# Patient Record
Sex: Female | Born: 1945 | Race: White | Hispanic: No | Marital: Married | Smoking: Former smoker
Health system: Southern US, Community
[De-identification: ages and names within clinical notes are randomized; demographics above are authoritative.]

## PROBLEM LIST (undated history)

## (undated) DIAGNOSIS — E119 Type 2 diabetes mellitus without complications: Secondary | ICD-10-CM

## (undated) DIAGNOSIS — I2510 Atherosclerotic heart disease of native coronary artery without angina pectoris: Secondary | ICD-10-CM

## (undated) DIAGNOSIS — N814 Uterovaginal prolapse, unspecified: Secondary | ICD-10-CM

## (undated) DIAGNOSIS — E785 Hyperlipidemia, unspecified: Secondary | ICD-10-CM

## (undated) DIAGNOSIS — E039 Hypothyroidism, unspecified: Secondary | ICD-10-CM

## (undated) DIAGNOSIS — I10 Essential (primary) hypertension: Secondary | ICD-10-CM

## (undated) HISTORY — DX: Essential (primary) hypertension: I10

## (undated) HISTORY — DX: Hypothyroidism, unspecified: E03.9

## (undated) HISTORY — PX: KNEE ARTHROSCOPY: SUR90

## (undated) HISTORY — PX: PERCUTANEOUS CORONARY STENT INTERVENTION (PCI-S): SHX6016

## (undated) HISTORY — DX: Uterovaginal prolapse, unspecified: N81.4

## (undated) HISTORY — PX: UTERINE SUSPENSION: SUR1430

## (undated) HISTORY — DX: Type 2 diabetes mellitus without complications: E11.9

## (undated) HISTORY — DX: Hyperlipidemia, unspecified: E78.5

## (undated) HISTORY — PX: VAGINAL HYSTERECTOMY: SUR661

## (undated) HISTORY — DX: Atherosclerotic heart disease of native coronary artery without angina pectoris: I25.10

---

## 1998-03-08 ENCOUNTER — Other Ambulatory Visit: Admission: RE | Admit: 1998-03-08 | Discharge: 1998-03-08 | Payer: Self-pay | Admitting: Obstetrics and Gynecology

## 1999-04-11 ENCOUNTER — Encounter: Admission: RE | Admit: 1999-04-11 | Discharge: 1999-04-11 | Payer: Self-pay | Admitting: Obstetrics and Gynecology

## 1999-04-11 ENCOUNTER — Encounter: Payer: Self-pay | Admitting: Internal Medicine

## 1999-04-12 ENCOUNTER — Other Ambulatory Visit: Admission: RE | Admit: 1999-04-12 | Discharge: 1999-04-12 | Payer: Self-pay | Admitting: Obstetrics and Gynecology

## 2000-04-09 ENCOUNTER — Encounter: Payer: Self-pay | Admitting: Obstetrics and Gynecology

## 2000-04-09 ENCOUNTER — Encounter: Admission: RE | Admit: 2000-04-09 | Discharge: 2000-04-09 | Payer: Self-pay | Admitting: Obstetrics and Gynecology

## 2000-06-26 ENCOUNTER — Other Ambulatory Visit: Admission: RE | Admit: 2000-06-26 | Discharge: 2000-06-26 | Payer: Self-pay | Admitting: Obstetrics and Gynecology

## 2001-04-24 ENCOUNTER — Encounter: Admission: RE | Admit: 2001-04-24 | Discharge: 2001-04-24 | Payer: Self-pay | Admitting: Obstetrics and Gynecology

## 2001-04-24 ENCOUNTER — Encounter: Payer: Self-pay | Admitting: Obstetrics and Gynecology

## 2002-06-21 ENCOUNTER — Encounter: Admission: RE | Admit: 2002-06-21 | Discharge: 2002-06-21 | Payer: Self-pay | Admitting: Obstetrics and Gynecology

## 2002-06-21 ENCOUNTER — Encounter: Payer: Self-pay | Admitting: Obstetrics and Gynecology

## 2003-06-30 ENCOUNTER — Encounter: Admission: RE | Admit: 2003-06-30 | Discharge: 2003-06-30 | Payer: Self-pay | Admitting: Obstetrics and Gynecology

## 2003-07-22 ENCOUNTER — Encounter (INDEPENDENT_AMBULATORY_CARE_PROVIDER_SITE_OTHER): Payer: Self-pay | Admitting: Specialist

## 2003-07-22 ENCOUNTER — Inpatient Hospital Stay (HOSPITAL_COMMUNITY): Admission: RE | Admit: 2003-07-22 | Discharge: 2003-07-24 | Payer: Self-pay | Admitting: Obstetrics and Gynecology

## 2004-05-12 ENCOUNTER — Inpatient Hospital Stay (HOSPITAL_COMMUNITY): Admission: EM | Admit: 2004-05-12 | Discharge: 2004-05-15 | Payer: Self-pay | Admitting: Emergency Medicine

## 2004-05-14 ENCOUNTER — Encounter: Payer: Self-pay | Admitting: Internal Medicine

## 2004-05-14 ENCOUNTER — Ambulatory Visit: Payer: Self-pay | Admitting: Cardiology

## 2004-05-31 ENCOUNTER — Ambulatory Visit: Payer: Self-pay | Admitting: Internal Medicine

## 2004-06-11 ENCOUNTER — Encounter (HOSPITAL_COMMUNITY): Admission: RE | Admit: 2004-06-11 | Discharge: 2004-08-28 | Payer: Self-pay | Admitting: Cardiology

## 2004-06-26 ENCOUNTER — Ambulatory Visit: Payer: Self-pay | Admitting: Cardiology

## 2004-07-20 ENCOUNTER — Ambulatory Visit: Payer: Self-pay | Admitting: Cardiology

## 2004-10-15 ENCOUNTER — Ambulatory Visit: Payer: Self-pay | Admitting: Cardiology

## 2005-01-18 ENCOUNTER — Encounter: Admission: RE | Admit: 2005-01-18 | Discharge: 2005-01-18 | Payer: Self-pay | Admitting: Internal Medicine

## 2005-03-06 ENCOUNTER — Encounter: Admission: RE | Admit: 2005-03-06 | Discharge: 2005-03-06 | Payer: Self-pay | Admitting: Internal Medicine

## 2005-04-03 ENCOUNTER — Ambulatory Visit: Payer: Self-pay | Admitting: Cardiology

## 2005-05-01 ENCOUNTER — Ambulatory Visit: Payer: Self-pay | Admitting: Cardiology

## 2005-05-24 ENCOUNTER — Encounter: Admission: RE | Admit: 2005-05-24 | Discharge: 2005-05-24 | Payer: Self-pay | Admitting: Internal Medicine

## 2005-07-30 ENCOUNTER — Ambulatory Visit: Payer: Self-pay | Admitting: Cardiology

## 2006-02-04 ENCOUNTER — Encounter: Admission: RE | Admit: 2006-02-04 | Discharge: 2006-02-04 | Payer: Self-pay | Admitting: Obstetrics and Gynecology

## 2006-02-20 ENCOUNTER — Ambulatory Visit: Payer: Self-pay | Admitting: Cardiology

## 2006-02-25 ENCOUNTER — Encounter: Payer: Self-pay | Admitting: Cardiology

## 2006-02-25 ENCOUNTER — Ambulatory Visit: Payer: Self-pay

## 2006-05-02 ENCOUNTER — Encounter: Admission: RE | Admit: 2006-05-02 | Discharge: 2006-05-02 | Payer: Self-pay | Admitting: Internal Medicine

## 2006-08-20 ENCOUNTER — Ambulatory Visit: Payer: Self-pay | Admitting: Cardiology

## 2007-02-16 ENCOUNTER — Encounter: Admission: RE | Admit: 2007-02-16 | Discharge: 2007-02-16 | Payer: Self-pay | Admitting: Obstetrics and Gynecology

## 2007-06-10 ENCOUNTER — Ambulatory Visit: Payer: Self-pay | Admitting: Cardiology

## 2008-03-18 ENCOUNTER — Encounter: Admission: RE | Admit: 2008-03-18 | Discharge: 2008-03-18 | Payer: Self-pay | Admitting: Obstetrics and Gynecology

## 2008-06-08 ENCOUNTER — Telehealth: Payer: Self-pay | Admitting: Cardiology

## 2008-07-07 DIAGNOSIS — E039 Hypothyroidism, unspecified: Secondary | ICD-10-CM

## 2008-07-07 DIAGNOSIS — I10 Essential (primary) hypertension: Secondary | ICD-10-CM

## 2008-07-07 DIAGNOSIS — I2510 Atherosclerotic heart disease of native coronary artery without angina pectoris: Secondary | ICD-10-CM | POA: Insufficient documentation

## 2008-07-14 ENCOUNTER — Ambulatory Visit: Payer: Self-pay | Admitting: Cardiology

## 2008-08-18 ENCOUNTER — Telehealth: Payer: Self-pay | Admitting: Cardiology

## 2008-09-15 ENCOUNTER — Encounter: Payer: Self-pay | Admitting: Cardiology

## 2009-04-18 ENCOUNTER — Encounter: Admission: RE | Admit: 2009-04-18 | Discharge: 2009-04-18 | Payer: Self-pay | Admitting: Internal Medicine

## 2009-09-21 ENCOUNTER — Ambulatory Visit: Payer: Self-pay | Admitting: Cardiology

## 2010-02-27 NOTE — Assessment & Plan Note (Signed)
Summary: f1y   Visit Type:  1 year follow up  CC:  No cardiac complains.  History of Present Illness: Overall doing well.  Denies any chest pain.  Using Voltaren gel on ankle and knee.  Otherwise doing well.  Denies trouble.  Current Medications (verified): 1)  Nitrostat 0.4 Mg Subl (Nitroglycerin) .Marland Kitchen.. 1 Tablet Under Tongue At Onset of Chest Pain; You May Repeat Every 5 Minutes For Up To 3 Doses. 2)  Plavix 75 Mg Tabs (Clopidogrel Bisulfate) .... Take One Tablet By Mouth Daily 3)  Simvastatin 40 Mg Tabs (Simvastatin) .... Take One Tablet By Mouth Daily At Bedtime 4)  Toprol Xl 100 Mg Xr24h-Tab (Metoprolol Succinate) .... Take 1 Tablet By Mouth Once A Day 5)  Protonix 40 Mg Tbec (Pantoprazole Sodium) .... Take 1 Tablet By Mouth Once A Day 6)  Aspirin 81 Mg Tbec (Aspirin) .... Take One Tablet By Mouth Daily 7)  Diovan 160 Mg Tabs (Valsartan) .... Take 1 Tablet By Mouth Once A Day 8)  Synthroid 137 Mcg Tabs (Levothyroxine Sodium) .... Take 1 Tablet By Mouth Once A Day 9)  Multivitamins  Tabs (Multiple Vitamin) .... Take 1 Tablet By Mouth Once A Day 10)  Fish Oil 1000 Mg Caps (Omega-3 Fatty Acids) .... Take 1 Capsule By Mouth Once A Day 11)  Vitamin D 2000 Unit Tabs (Cholecalciferol) .... Take 1 Tablet By Mouth Once A Day  Allergies: 1)  Codeine Phosphate (Codeine Phosphate)  Vital Signs:  Patient profile:   65 year old female Height:      63 inches Weight:      199.50 pounds BMI:     35.47 Pulse rate:   88 / minute Pulse rhythm:   regular Resp:     18 per minute BP sitting:   150 / 80  (left arm) Cuff size:   large  Vitals Entered By: Vikki Ports (September 21, 2009 12:20 PM)  Physical Exam  General:  Well developed, well nourished, in no acute distress. Head:  normocephalic and atraumatic Eyes:  PERRLA/EOM intact; conjunctiva and lids normal. Lungs:  Clear bilaterally to auscultation and percussion. Heart:  PMI non displaced.  Normal S1 and S2.  No murmur.  Pulses:   pulses normal in all 4 extremities Extremities:  No clubbing or cyanosis. Neurologic:  Alert and oriented x 3.   Cardiac Cath  Procedure date:  05/12/2004  Findings:       ANGIOGRAPHIC DATA:  1.  The left main coronary artery was free of critical disease.  2.  The LAD has about a 40% area of narrowing at the takeoff of the      diagonal.  Distal to this there is a segmental area of disease measuring      about 50-60% luminal reduction.  The distal vessel wraps the apex.  The      circumflex provides three marginal branches and has mild luminal      irregularity but no critical stenoses.  3.  The right coronary is totally occluded in its mid portion.  Following      stenting, this vessel was reduced to 0% residual luminal narrowing and      is a large caliber vessel with about 3.2 luminal diameter.  There was no      evidence of edge tear. There was TIMI III flow in the distal vessel      after reperfusion.  There is mild luminal irregularity of the distal      vessel  but no high-grade areas of stenosis.  4.  The ventriculogram demonstrates inferobasal hypokinesis with ejection      fraction of 45%.  5.  On the initial left injections, there was evidence of distal      collateralization of the right coronary artery.   CONCLUSION:  1.  Acute inferior wall infarction, treated with primary percutaneous      coronary intervention.  2.  Hypertension.  3.  Hypothyroidism.   EKG  Procedure date:  09/21/2009  Findings:      NSR.  Inferior MI, old.    Impression & Recommendations:  Problem # 1:  CAD (ICD-414.00)  Completely stable. Discussed DAPT.  Continue to treat.  She is agreeable to current regimen.  We also went into great detail regarding interactions of platelets, and NSAIDS.  She clearly seems to need, but is also fully informed regarding potential risks. Her updated medication list for this problem includes:    Nitrostat 0.4 Mg Subl (Nitroglycerin) .Marland Kitchen... 1 tablet under  tongue at onset of chest pain; you may repeat every 5 minutes for up to 3 doses.    Plavix 75 Mg Tabs (Clopidogrel bisulfate) .Marland Kitchen... Take one tablet by mouth daily    Toprol Xl 100 Mg Xr24h-tab (Metoprolol succinate) .Marland Kitchen... Take 1 tablet by mouth once a day    Aspirin 81 Mg Tbec (Aspirin) .Marland Kitchen... Take one tablet by mouth daily  Her updated medication list for this problem includes:    Nitrostat 0.4 Mg Subl (Nitroglycerin) .Marland Kitchen... 1 tablet under tongue at onset of chest pain; you may repeat every 5 minutes for up to 3 doses.    Plavix 75 Mg Tabs (Clopidogrel bisulfate) .Marland Kitchen... Take one tablet by mouth daily    Toprol Xl 100 Mg Xr24h-tab (Metoprolol succinate) .Marland Kitchen... Take 1 tablet by mouth once a day    Aspirin 81 Mg Tbec (Aspirin) .Marland Kitchen... Take one tablet by mouth daily  Orders: EKG w/ Interpretation (93000)  Problem # 2:  HYPERLIPIDEMIA (ICD-272.4)  followed by Dr. Kevan Ny. Her updated medication list for this problem includes:    Simvastatin 40 Mg Tabs (Simvastatin) .Marland Kitchen... Take one tablet by mouth daily at bedtime  Her updated medication list for this problem includes:    Simvastatin 40 Mg Tabs (Simvastatin) .Marland Kitchen... Take one tablet by mouth daily at bedtime  Problem # 3:  HYPERTENSION (ICD-401.9) Systolic BP higher.   Her updated medication list for this problem includes:    Toprol Xl 100 Mg Xr24h-tab (Metoprolol succinate) .Marland Kitchen... Take 1 tablet by mouth once a day    Aspirin 81 Mg Tbec (Aspirin) .Marland Kitchen... Take one tablet by mouth daily    Diovan 160 Mg Tabs (Valsartan) .Marland Kitchen... Take 1 tablet by mouth once a day  Her updated medication list for this problem includes:    Toprol Xl 100 Mg Xr24h-tab (Metoprolol succinate) .Marland Kitchen... Take 1 tablet by mouth once a day    Aspirin 81 Mg Tbec (Aspirin) .Marland Kitchen... Take one tablet by mouth daily    Diovan 160 Mg Tabs (Valsartan) .Marland Kitchen... Take 1 tablet by mouth once a day  Problem # 4:  HYPOTHYROIDISM (ICD-244.9)  Dr.Gates checks regularly.  Her updated medication list for  this problem includes:    Synthroid 137 Mcg Tabs (Levothyroxine sodium) .Marland Kitchen... Take 1 tablet by mouth once a day  Her updated medication list for this problem includes:    Synthroid 137 Mcg Tabs (Levothyroxine sodium) .Marland Kitchen... Take 1 tablet by mouth once a day  Patient Instructions: 1)  Your physician recommends that you continue on your current medications as directed. Please refer to the Current Medication list given to you today. 2)  Your physician wants you to follow-up in: 1 year  You will receive a reminder letter in the mail two months in advance. If you don't receive a letter, please call our office to schedule the follow-up appointment.

## 2010-03-26 ENCOUNTER — Other Ambulatory Visit: Payer: Self-pay | Admitting: Obstetrics and Gynecology

## 2010-03-26 DIAGNOSIS — Z1231 Encounter for screening mammogram for malignant neoplasm of breast: Secondary | ICD-10-CM

## 2010-04-20 ENCOUNTER — Ambulatory Visit
Admission: RE | Admit: 2010-04-20 | Discharge: 2010-04-20 | Disposition: A | Discharge status: Home / Self Care | Payer: BC Managed Care – PPO | Source: Ambulatory Visit | Attending: Obstetrics and Gynecology | Admitting: Obstetrics and Gynecology

## 2010-04-20 DIAGNOSIS — Z1231 Encounter for screening mammogram for malignant neoplasm of breast: Secondary | ICD-10-CM

## 2010-06-12 NOTE — Assessment & Plan Note (Signed)
[facility]                                 ON-CALL NOTE   NAME:LANGLEYLinden, Ebony [name]                         MRN:          [id number]  DATE:03/13/2008                            DOB:          [birth year]    PRIMARY CARDIOLOGIST:  Arturo Morton. Riley Kill, MD, [facility]   PROBLEM:  I was contacted by Ms. Mcarthy's daughter, concerned that her  mother has been experiencing some dizziness from this morning.  She  denies any chest pain, shortness of breath, or tachy palpitations.  This  appears to be related to standing as well as gazing downward.  I also  spoke to Ms. Schweigert herself, who did not appear to be in any distress.  We reviewed her medications and she is on Toprol-XL 100 mg q.a.m. and  Diovan 160 mg q.a.m.  She did take her medications this morning.  A  blood pressure and pulse was obtained by the daughter, notable for  167/89 and pulse of 81.   PLAN:  I advised Ms. Langely to imbibe fluids liberally.  She has a  history of normal ventricular function.  Of note, she has no known  history of paroxysmal atrial fibrillation.  I then asked that she have  her daughter repeat her blood pressure lying down, and then upon  standing with at least two measurements.  She is to then page me with an  update.  Further recommendations can be made at that time.  I also  advised Ms. Icenhour that if she feels worse, she can certainly proceed  directly to a local urgent care clinic for further evaluation.  Ms.  Lausch appreciated this call and was in agreement with this initial  plan.      Gene Serpe, PA-C  Electronically Signed      Arturo Morton. Riley Kill, MD, [facility]  Electronically Signed   GS/MedQ  DD: 03/13/2008  DT: 03/13/2008  Job #: [id number]

## 2010-06-12 NOTE — Letter (Signed)
August 20, 2006    Candyce Churn, M.D.  [address] [place], [place] [address]   RE:  NYELA, CORTINAS  MRN:  [id number]  /  DOB:  July 18, 1945   Dear Elson Areas:   Ms. Walthall returned today in follow-up.  Cardiac-wise, she continues to  get along well.  She denies any chest discomfort.  She does get a little  discomfort when she lays down at night, which almost sounds like reflux.  She does attribute this to her Vytorin.  However, it has not been  severe.  She wondered about the timing of Vytorin and I told her that I  thought she could take this at really any time of the day especially as  a test to see if there is any relationship to the symptoms.   PHYSICAL EXAMINATION:  VITAL SIGNS:  Weight 194 pounds, blood pressure  134/76, pulse 87.  LUNGS:  The lung fields are clear.  CARDIOVASCULAR:  Regular rhythm.   The electrocardiogram demonstrates sinus rhythm.  There is some age  indeterminate inferior wall myocardial infarction.  This is basically  unchanged from prior tracings.  The tracings are otherwise unremarkable.   Overall, she continues to do well.  I plan to see her back in follow-up  in six months or sooner should further problems arise.  As an addendum,  the lead position on V1 and V2 was likely reversed.    Sincerely,      Arturo Morton. Riley Kill, MD, [facility]  Electronically Signed    TDS/MedQ  DD: 08/20/2006  DT: 08/21/2006  Job #: [id number]

## 2010-06-12 NOTE — Letter (Signed)
Jun 10, 2007    Ebony [name], M.D.  [address], [address]  [place], [place] [address]   RE:  [name], [name]  MRN:  [id number]  /  DOB:  14-Nov-1945   Dear Granville Lewis:   I had the pleasure seeing Ebony [name] in the office today in follow-up.  She is doing well from a cardiac standpoint.  She denies any chest pain  or shortness of breath.  Her mother died recently and so she has not  been exercising on a regular basis.  She was hypertensive in the office  today but her blood pressures have been checked regularly at home and  have been in good shape.  As you know, this nice lady presented with an  acute myocardial infarction and had a drug-eluting stent placed in her  distal right coronary.  In addition, she was hypothyroid and appropriate  treatment with thyroid replacement has been done.   CURRENT MEDICATIONS:  1. Aspirin 81 mg daily.  2. Plavix 75 mg daily.  3. Toprol XL 100 mg daily.  4. Diovan 160 mg daily.  5. Synthroid 125 mcg daily.  6. Prilosec 20 mg daily.  7. Simvastatin 40 mg daily.   On physical, she is alert and oriented.  Blood pressure is 162/92, the pulse is 60.  The carotid upstrokes are brisk without bruit.  The lung fields are clear.  The cardiac rhythm is regular without a significant murmur.   EKG reveals normal sinus rhythm.  There is an inferior infarct of  indeterminate age.  Cannot rule out anterior infarction.   In summary, this very nice lady is doing well.  She had a drug-eluting  stent placed in her right coronary artery.  She is on dual antiplatelet  therapy and until we know better, I would recommend continuing this  unless she has major contraindications to continued Plavix therapy.  This is placed in the setting of a myocardial infarction.  With regard  to the fact that she is going to have an upcoming colonoscopy, she is  out far enough that she could discontinue her Plavix for approximately 5  days but I would remain on aspirin  during that period.   We will see her back in continued follow-up in 1 year and at that time  bring her up to date on the issue with regard dual antiplatelet therapy.   Thanks so much for allowing me to share in her care.    Sincerely,      Arturo Morton. Riley Kill, MD, [facility]  Electronically Signed    TDS/MedQ  DD: 06/10/2007  DT: 06/10/2007  Job #: [id number]

## 2010-06-15 NOTE — Op Note (Signed)
NAME:  Ebony [name], Ebony [name]                          ACCOUNT NO.:  [id number]   MEDICAL RECORD NO.:  [id number]                   PATIENT TYPE:  INP   LOCATION:  X004                                 FACILITY:  [facility]   PHYSICIAN:  Mark C. Vernie Ammons, M.D.               DATE OF BIRTH:  10/30/1945   DATE OF PROCEDURE:  07/22/2003  DATE OF DISCHARGE:                                 OPERATIVE REPORT   PREOPERATIVE DIAGNOSIS:  Stress urinary incontinence.   POSTOPERATIVE DIAGNOSIS:  Stress urinary incontinence.   PROCEDURE:  OB tape sling.   SURGEON:  Mark C. Vernie Ammons, M.D.   ANESTHESIA:  General.   DRAINS:  16 French Foley catheter.   BLOOD LOSS:  Approximately 20 mL.   SPECIMENS:  None.   COMPLICATIONS:  None.   INDICATIONS:  The patient is a 65 year old white female patient of Dr.  Ebony Hail, who was seen in my office for evaluation of stress urinary  incontinence.  It has been progressively worsening over the past 2 years,  occurring when she bends, squats, sneezes, coughs, and very little  provocation whatsoever.  She has no irritative voiding symptoms.  She is  noted to have a large cystocele with loss of urethrovesical junction support  with Valsalva.  She has elected to have OB tape sling at the time of her  hysterectomy and anterior repair by Dr. Ambrose Mantle.   DESCRIPTION OF OPERATION:  After informed consent, the patient brought to  the major OR, placed on the table, administered general anesthesia, and then  moved to the dorsal lithotomy position.  Genitalia was sterilely prepped and  draped, and Dr. Ambrose Mantle proceeded with transvaginal hysterectomy and anterior  repair.  I used the most distal portion of his anterior vaginal incision to  palpate the urethra and could feel the bladder neck and Foley catheter in  the urethra.  I determined the mid urethral level and then palpated the  obturator fossa.  Just above the level of the clitoris, approximately 5 cm  laterally, stab  incisions were made in the skin, and the bladder was  completely drained through the Foley catheter.  The obturator sling trocar  was then passed through the skin incision, through the obturator fossa at  its most medial aspect and behind the symphysis pubis and directed onto my  finger out through the vaginal incision at the mid urethral level.  This was  performed on the left side, and the obturator tape was then placed in the  eye of the trocar and brought out through the skin incision.  An identical  procedure was performed on the right side.  With the tape in place but not  pulled into position yet, I removed the Foley catheter and inserted the  cystoscope with the 70-degree lens.  The bladder was fully inspected and  noted to be free of any tumor, stones, or inflammatory lesions.  There  was a  single submillimeter nodule of cystitis follicularis just distal to the  right ureteral orifice.  There was no evidence of injury to the bladder or  the urethra.  The cystoscope was therefore removed, and the obturator sling  tape was then positioned with no tension at the mid urethral level.  The  excess tape  was then cut at the skin level, and the skin incisions were closed with  Dermabond.  The vaginal incision was closed by Dr. Ambrose Mantle, and the patient  was awakened and taken to recovery room in stable satisfactory condition and  tolerated the procedure well with no complication.                                               Mark C. Vernie Ammons, M.D.    MCO/MEDQ  D:  07/22/2003  T:  07/22/2003  Job:  [id number]   cc:   Malachi Pro. Ambrose Mantle, M.D.  [address]  [address]  [place]  [place] [address]  Fax: [phone]

## 2010-06-15 NOTE — Cardiovascular Report (Signed)
Ebony [name], Ebony [name]                ACCOUNT NO.:  [id number]   MEDICAL RECORD NO.:  [id number]          PATIENT TYPE:  INP   LOCATION:  2929                         FACILITY:  [facility]   PHYSICIAN:  Arturo Morton. Riley Kill, M.D. [facility] OF BIRTH:  10-13-1945   DATE OF PROCEDURE:  05/12/2004  DATE OF DISCHARGE:                              CARDIAC CATHETERIZATION   INDICATIONS:  Ms. Lubin is a 65 year old who has been having some tooth  pain.  She was subsequently referred to a dentist where she was seen and a  root canal planned.  Tonight she developed worsening tooth pain and  discomfort in the chest.  In the emergency room she had a diagnostic  electrocardiogram for inferior wall infarction.  The patient has a prior  history of hypertension.  There is also a history of hypothyroidism.   PROCEDURES:  1.  Left heart catheterization  2.  Selective coronary territory.  3.  Selective left ventriculography.  4.  PTCA and stenting of the right coronary artery.   DESCRIPTION OF PROCEDURE:  The patient was brought to the cath lab and  prepped and draped in usual fashion.  Following informed consent, through an  anterior puncture the right femoral artery was easily entered.  A 6-French  sheath was placed, views of the left and right coronary arteries were  obtained in multiple angiographic projections.  Central aortic and left  ventricular pressures were measured with a pigtail.  The patient had an  occluded right coronary artery as we were preparing to do a percutaneous  intervention, the imaging chain went down.  There was a period of about 15  minutes and getting the equipment back up again without initial digital  imaging, we were able to cross the right coronary artery.  We subsequently  stented the right coronary artery with when the digital imaging chain came  up, and we used a 3.0 x 16 Taxus drug-eluting stent to directly stent the  vessel.  Post dilatation was done with a PowerSail  balloon with inflations  as high as 14 atmospheres throughout the mid portion of the stent.  The  edges were post dilated as well.  There was re-establishment of TIMI III  flow.  We used intracoronary verapamil to try to improve microvascular  perfusion.  There was some resolution of ST segments in relief of chest  pain.  Following this, all catheters were subsequently removed and the  femoral sheath sewn into place.  She was taken to the CCU in satisfactory  clinical condition.   HEMODYNAMIC DATA:  1.  Central aortic pressure 174/103, mean 138.  2.  Left ventricular pressure 159/35.  3.  No gradient on pullback across aortic valve.   ANGIOGRAPHIC DATA:  1.  The left main coronary artery was free of critical disease.  2.  The LAD has about a 40% area of narrowing at the takeoff of the      diagonal.  Distal to this there is a segmental area of disease measuring      about 50-60% luminal reduction.  The distal vessel wraps the  apex.  The      circumflex provides three marginal branches and has mild luminal      irregularity but no critical stenoses.  3.  The right coronary is totally occluded in its mid portion.  Following      stenting, this vessel was reduced to 0% residual luminal narrowing and      is a large caliber vessel with about 3.2 luminal diameter.  There was no      evidence of edge tear. There was TIMI III flow in the distal vessel      after reperfusion.  There is mild luminal irregularity of the distal      vessel but no high-grade areas of stenosis.  4.  The ventriculogram demonstrates inferobasal hypokinesis with ejection      fraction of 45%.  5.  On the initial left injections, there was evidence of distal      collateralization of the right coronary artery.   CONCLUSION:  1.  Acute inferior wall infarction, treated with primary percutaneous      coronary intervention.  2.  Hypertension.  3.  Hypothyroidism.   PLAN:  1.  The patient be treated with aspirin  and Plavix for minimum of 1 year.  2.  Follow-up will be with Dr. Riley Kill and Dr. Kevan Ny.  3.  The patient will be on thyroid replacement therapy as well.      TDS/MEDQ  D:  05/12/2004  T:  05/12/2004  Job:  [id number]   cc:   Candyce Churn, M.D.  [address] [place]  [place] [address]  Fax: [phone]   Arturo Morton. Riley Kill, M.D. [facility]   CV Laboratory   patient's medical record   Celene Kras, MD  Fax: [phone]

## 2010-06-15 NOTE — Discharge Summary (Signed)
NAME:  Ebony [name], Ebony [name]                          ACCOUNT NO.:  [id number]   MEDICAL RECORD NO.:  [id number]                   PATIENT TYPE:  INP   LOCATION:  0454                                 FACILITY:  [facility]   PHYSICIAN:  Malachi Pro. Ambrose Mantle, M.D.              DATE OF BIRTH:  [birth year]   DATE OF ADMISSION:  07/22/2003  DATE OF DISCHARGE:  07/24/2003                                 DISCHARGE SUMMARY   HOSPITAL COURSE:  This is a 65 year old white female with cystocele,  rectocele, uterine prolapse, and stress urinary incontinence admitted for a  vaginal hysterectomy, A&P repair, and a sling procedure by Dr. Vernie Ammons.  The  patient underwent the procedure described on July 22, 2003.  Did well  postoperatively, voided well without difficulty after the catheter was  removed on the first postop day, and was discharged on the second postop  day.  The only blip of abnormality in her postop course was slight numbness  on the inner aspect of both thighs close to her knee and also a temperature  elevation to 100.5 degrees on the first day after surgery.  She is  asymptomatic, feels well, as a matter of fact feels that she should have  been discharged on the first postop day.  I am going to obtain a cathed  urine for urinalysis and culture, discharge her, have her monitor her  temperature, and if her temperature rises above 100.4 she is to call me.  Initial hemoglobin was 15.6, hematocrit 45.1, white count 7700, platelet  count 218,000.  Follow up hematocrits 39.1 and 38.4.  Comprehensive  metabolic profile was normal except for a sodium of 134, potassium of 3.2  and a glucose of 153.  She was placed on potassium and a repeat potassium  was 3.8.  Urinalysis was negative.  Chest x-ray showed no active disease.  EKG showed sinus tachycardia, questionable normal EKG.  No tracings to  compare.  Pathology report is not available at this time.   FINAL DIAGNOSES:  1. Uterine prolapse.  2. Cystocele.  3. Rectocele.  4. Stress urinary incontinence.   OPERATION:  Vaginal hysterectomy, A&P repair, obturator sling procedure by  Dr. Vernie Ammons.   FINAL CONDITION:  Improved.   INSTRUCTIONS:  Include regular diet, no vaginal entrance, no heavy lifting  or strenuous activity.  Call with any temperature elevation greater than  100.4 degrees.  Call with any unusual problems.  The patient declines  analgesics at discharge.  She is to resume her medications at home including  Maxzide, Toprol, and Synthroid.  She is to return to see me in 10 to 14 days  for follow up examination or for any problems.  Malachi Pro. Ambrose Mantle, M.D.   TFH/MEDQ  D:  07/24/2003  T:  07/24/2003  Job:  [id number]   cc:   Loraine Leriche C. Vernie Ammons, M.D.  [address]  [place]  [place] [address]  Fax: [phone]

## 2010-06-15 NOTE — H&P (Signed)
NAME:  Ebony [name], Ebony [name]                          ACCOUNT NO.:  [id number]   MEDICAL RECORD NO.:  [id number]                   PATIENT TYPE:  INP   LOCATION:  NA                                   FACILITY:  [facility]   PHYSICIAN:  Malachi Pro. Ambrose Mantle, M.D.              DATE OF BIRTH:  01-30-1945   DATE OF ADMISSION:  07/22/2003  DATE OF DISCHARGE:                                HISTORY & PHYSICAL   This is a 65 year old white female, para 3, 0-2-2, who is admitted to the  hospital for vaginal hysterectomy, A&P repair, and sling procedure because  of uterine prolapse, large cystocele, smaller rectocele, and severe stress  urinary incontinence.  Last menstrual period was July, 1999.  This patient  has had prolapse of the uterus and cystocele, at least a couple of years.  She did begin to complain more of protrusion and stress urinary incontinence  in 2005.  She has been seen by Dr. Ronal Fear.  He feels that she would be best  served by a sling procedure.  Patient reports stress urinary incontinence  with cough, sneeze, laugh, bending, squatting, and she feels a protrusion.   ALLERGIES:  1. CODEINE.  2. MACRODANTIN.  3. SULFA.   MEDICATIONS:  Toprol.  Synthroid.  Maxzide.   OPERATIONS:  She has had knee operations bilaterally.  She has had two C-  sections.   She has high blood pressure.  No serious heart problems.  No alcohol or  tobacco.   REVIEW OF SYSTEMS:  Negative.   FAMILY HISTORY:  Mother 72 with diabetes.  Father died of 67 with poor  circulation.  Two half sisters are living and well.  One half brother  living.  Two brothers are living, and two brothers are dead.   PHYSICAL EXAMINATION:  VITAL SIGNS:  Blood pressure 144/92, pulse 80.  Weight 195-1/2 pounds.  GENERAL:  A well-developed, attractive white female.  HEENT:  No cranial abnormalities.  Extraocular movements are intact.  Nose  and pharynx are clear.  NECK:  Supple without thyromegaly.  LUNGS:  Clear to P&A.  HEART:   Normal size and sound.  No murmur.  BREASTS:  Soft without masses.  ABDOMEN:  Soft.  Nontender.  No masses are palpable.  Liver, spleen, and  kidneys not felt.  PELVIC:  The vulva and vagina are clean.  There is a 4th degree cystocele  and 2nd degree rectocele.  Moderate uterine prolapse.  The cervix is clean.  The uterus is mid, clean, and small.  The adnexa are free of masses.  RECTAL:  Negative.   Pap smear in June, 2004 was negative for intraepithelial neoplasia.   ADMITTING IMPRESSION:  Uterine prolapse, cystocele, rectocele, stress  urinary incontinence.  The patient was also noted to have a potassium of  3.2.   She has been placed on potassium supplementation the day prior to surgery.  She has been  informed of the risks of surgery, including but not limited to  heart attack, stroke, pulmonary embolus, wound disruption, hemorrhage with  need for reoperation and/or transfusion, fistula formation, nerve injury,  and intestinal obstruction.  She understands and agrees to proceed.                                               Malachi Pro. Ambrose Mantle, M.D.    TFH/MEDQ  D:  07/21/2003  T:  07/21/2003  Job:  [id number]   cc:   Veverly Fells. Vernie Ammons, M.D.  [address]  [place]  [place] [address]  Fax: [phone]

## 2010-06-15 NOTE — Op Note (Signed)
NAME:  Ebony [name], Ebony [name]                          ACCOUNT NO.:  [id number]   MEDICAL RECORD NO.:  [id number]                   PATIENT TYPE:  INP   LOCATION:  X004                                 FACILITY:  [facility]   PHYSICIAN:  Malachi Pro. Ambrose Mantle, M.D.              DATE OF BIRTH:  [birth year]   DATE OF PROCEDURE:  07/22/2003  DATE OF DISCHARGE:                                 OPERATIVE REPORT   PREOPERATIVE DIAGNOSES:  Cystocele, rectocele, uterine prolapse, stress  urinary incontinence.   POSTOPERATIVE DIAGNOSES:  Cystocele, rectocele, uterine prolapse, stress  urinary incontinence.   OPERATION:  Vaginal hysterectomy, A&P repair.   SURGEON:  Malachi Pro. Ambrose Mantle, M.D.   ASSISTANT:  Huel Cote, M.D. for the vaginal hysterectomy, A&P repair.  Mark C. Vernie Ammons, M.D. was the operator for the sling procedure.   ANESTHESIA:  General.   DESCRIPTION OF PROCEDURE:  The patient was brought to the operating room and  placed under satisfactory general anesthesia and placed in lithotomy  position.  Exam revealed a large cystocele, moderate uterine prolapse and a  smaller rectocele. The abdomen, vulva, vagina, and urethra were prepped with  Betadine solution, a Foley catheter was inserted to straight drain.  The  area was draped as a sterile field and the cervix was drawn into the  operative field.  A dilute solution of Pitressin made by putting 10 units of  Pitressin and 120 mL of saline was used to inject the cervicovaginal  junction, a total of 12 mL was used approximately 1 unit of Pitressin.  A  circumferential incision was made around the cervix at the cervicovaginal  junction, the anterior vaginal mucosa was pushed anteriorly and then the  posterior cul-de-sac was entered.  The uterosacral ligaments were clamped,  cut and suture ligated as were the cardinal ligaments. The uterosacral  ligaments were held.  The anterior vaginal mucosa was identified and entered  and the bladder was  retracted away. Additional bites were taken along the  broad ligament bilaterally. The uterus was then inverted through the  incision in the cul-de-sac, the upper pedicles were clamped across with two  clamps bilaterally, the uterus was removed.  I did not actually see the  ovaries. The upper pedicles were doubly suture ligated and held, hemostasis  appeared adequate. The posterior vaginal cuff was sutured with a running  locked suture of #0 Vicryl. Reperitonealization was then done with a running  suture of #0 Vicryl incorporating the upper peritoneum, the upper pedicles,  the uterosacral ligaments and the posterior peritoneum. This was not tied.  The uterosacral ligaments were then sutured together in the midline above  the peritoneal closure, hemostasis was then adequate. The pursestring suture  was tied down closing off the peritoneal cavity and the uterosacral  ligaments were sutured together in the midline extraperitoneally.  Therefore  we had three sutures in the uterosacral ligaments.  The anterior vaginal  mucosa was then undermined to a point just distal to the urethral meatus,  the cystocele was developed and obliterated with several interrupted sutures  of #0 Vicryl. Redundant vaginal mucosa was cut away and then the vaginal  mucosa was reunited in the midline starting at the cuff and progressing up  to close to the urethral meatus leaving Dr. Vernie Ammons room to work.  The  posterior vaginal mucosa was then cut across at the vaginal introitus, the  posterior vaginal mucosa was separated from the rectocele all the way to the  cuff.  Initially the dissection was somewhat difficult because of the  scarring from the episiotomy but the dissection went well. A rectal exam  confirmed that there was no entry into the rectum, the rectocele was  obliterated with several interrupted sutures of #0 Vicryl, redundant vaginal  mucosa was cut away and the vagina was reunited with interrupted  figure-of-  eight sutures of #0 Vicryl, the perineum was rebuilt with a running 3-0  Vicryl suture.  At this point, Dr. Vernie Ammons entered the operating theatre and  performed his sling procedure which he will dictate separately. The vaginal  mucosa was then reunited with interrupted figure-of-eight sutures of #0  Vicryl. There was no significant bleeding, a 2 inch iodoform pack was placed  into the vagina at the end of the procedure. The patient seemed to tolerate  the procedure well.  Blood loss was estimated at 250-300 mL.  Sponge and  needle counts were correct and the patient returned to recovery in  satisfactory condition.                                               Malachi Pro. Ambrose Mantle, M.D.    TFH/MEDQ  D:  07/22/2003  T:  07/22/2003  Job:  [id number]   cc:   Veverly Fells. Vernie Ammons, M.D.  [address]  [place]  [place] [address]  Fax: [phone]

## 2010-06-15 NOTE — Assessment & Plan Note (Signed)
[facility]                            CARDIOLOGY OFFICE NOTE   NAME:[name], Ebony OHMS                       MRN:          [id number]  DATE:02/20/2006                            DOB:          10-20-1945    REFERRING PHYSICIAN:  Arturo Morton. Riley Kill, MD, [facility]   [name] is in for followup. She is stable. She has not been having  any ongoing chest pain. She and I spent quite sometime discussing the  recent data regarding Vytorin. Her blood pressures at home have been in  the 120/70 range. She has had a sinus infection today and she has been  taking Mucinex.   PHYSICAL EXAMINATION:  Today on exam the blood pressure is 167/87, pulse  93, weight 195.  LUNG FIELDS: Clear.  CARDIAC: Rhythm is regular. No significant murmurs noted.   The electrocardiogram demonstrates normal sinus rhythm within normal  limits.   The patient is doing well. She has had a Taxus stent placed for acute  myocardial infarction.  Based on this we will recommend continued  aspirin and Plavix indefinitely for the present time. This will await  further trial information. She will continue on the same medical  regimen. With regard to her blood pressure she said it has been  significantly lower when she did not have a sinus infection. We have  encouraged her to follow up with this and she does check her blood  pressures regularly. She will continue to have her lipids followed at  Dr. Kevan Ny' office. With regard to her current use of Vytorin, we  encouraged her to remain on this pending results of the IMPROVE IT study  results. We will see her in 6 months. We will also get a 2D echo to  follow up on her left ventricular function.     Arturo Morton. Riley Kill, MD, [facility]  Electronically Signed    TDS/MedQ  DD: 02/20/2006  DT: 02/20/2006  Job #: [id number]   cc:   Candyce Churn, M.D.

## 2010-06-15 NOTE — Discharge Summary (Signed)
NAMETERRINA, DOCTER                ACCOUNT NO.:  [id number]   MEDICAL RECORD NO.:  [id number]          PATIENT TYPE:  INP   LOCATION:  2039                         FACILITY:  [facility]   PHYSICIAN:  Arturo Morton. Riley Kill, M.D. [facility] OF BIRTH:  23-May-1945   DATE OF ADMISSION:  05/11/2004  DATE OF DISCHARGE:  05/15/2004                                 DISCHARGE SUMMARY   PRINCIPAL DIAGNOSIS:  Inferior ST elevation myocardial infarction.   OTHER DIAGNOSES:  1.  Hypertension.  2.  Hypothyroidism.  3.  Status post C-section.   ALLERGIES:  CODEINE.   PROCEDURE:  Left heart catheterization with percutaneous coronary  intervention and stenting of the right coronary artery with a drug eluting  stent.   HISTORY OF PRESENT ILLNESS:  A 65 year old married white female with no  prior history of CAD, past medical history of hypertension who was in her  usual state of health until late last week when she began to experience left-  sided jaw and tooth discomfort. She was seen by her PCP and apparently  underwent ECG at that time which was normal.  She subsequently followed up  with her dentist, was told that she needed a root canal and that the pain  she was feeling may be referred from that.   On April 14, she had recurrent jaw and teeth discomfort and therefore  presented to [facility] at approximately 8:30 p.m. where ECG showed inferior  ST segment elevations. She ruled in for inferior ST elevation MI with a peak  CK of 3165 and a peak MB of 153.  Her troponin I was 61.51.   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  She then underwent  successful PCI and stenting of the proximal right coronary artery with a 3.0  x 16 mm Taxus drug eluting stent.  She was then taken  back to CCU where she  remained stable and pain-free.   The patient underwent a transthoracic echocardiogram on April 17 which  showed MPF estimated between 65-75% with mild diastolic dysfunction.  She  was transferred back to the floor on April 16.  She ws noted to have a  small, right groin hematoma and therefore underwent ultrasound which was  negative for pseudoaneurysm or AV fistula.  This morning, she was ambulating  in the hallways without recurrent chest discomfort or limitations.   We had a long discussion regarding her discharge medications and follow-up,  the importance of cardiac rehab. She is being discharged home today in  satisfactory condition.   DISCHARGE LABORATORY DATA:  Hemoglobin 12.5, hematocrit 37.2, WBC 9.4,  platelets 191.  Sodium 142, potassium 4.1, chloride 113, CO2 26, BUN 11,  creatinine 0.7, glucose 103.  PT 11.9, INR 0.9, PTT 29. Total bilirubin 0.6,  alkaline phosphatase 86, AST 29, ALT 31.  Peak CK 3175, peak MB 153, peak  troponin  T 61.51. Calcium 8.8, magnesium 2.2.   DISCHARGE PHYSICAL EXAMINATION:  VITAL SIGNS:  Blood pressure 130/60, heart  rate 86, respirations 18, temperature 97.2, pulse oximetry 96% on room air.  GENERAL:  Elderly white female in no acute distress, awake, alert and  oriented x3.  NECK:  No bruits or JVD.  LUNGS:  Respirations regular and unlabored, clear to auscultation.  CARDIOVASCULAR:  Regular S1/S2, no S3, S4 or murmurs.  ABDOMEN:  Round, soft, nontender, nondistended. Bowel sounds normal x4.  No  hepatosplenomegaly.  EXTREMITIES:  Warm and dry, pink. No clubbing, cyanosis or edema. Dorsalis  pedis and posterior tibial pulses 2+ and equal bilaterally.  The right groin  site which was used for cath and PCI has a resolving hematoma without bruit  or bleeding.   DISPOSITION:  The patient was discharged to home in good condition.   FOLLOW-UP APPOINTMENTS:  She was asked to follow-up with her primary care  physician, Dr.  Johnella Moloney in 3-4 weeks, and has appointment with Dr.  Shawnie Pons in [facility] in 2 weeks.   DISCHARGE MEDICATIONS:  1.  Aspirin 81 mg daily.  2.  Plavix 75 mg daily x minimum of 6 months, preferable to 1 year.  3.  Toprol  XL 100 mg daily.  4.  Zocor 40 mg q.h.s.  5.  Synthroid 137 mcg daily.  6.  Nitroglycerin 0.4 mg sublingual p.r.n. chest pain.  7.  Altace 2.5 mg daily.   PENDING LABORATORY DATA:  None.   Time in preparation discharge encounter:  45 minutes.      CRB/MEDQ  D:  05/15/2004  T:  05/15/2004  Job:  [id number]

## 2010-10-12 ENCOUNTER — Encounter: Payer: Self-pay | Admitting: Cardiology

## 2010-10-12 ENCOUNTER — Encounter: Payer: Self-pay | Admitting: *Deleted

## 2010-10-15 ENCOUNTER — Encounter: Payer: Self-pay | Admitting: Cardiology

## 2010-10-15 ENCOUNTER — Ambulatory Visit (INDEPENDENT_AMBULATORY_CARE_PROVIDER_SITE_OTHER): Payer: BC Managed Care – PPO | Admitting: Cardiology

## 2010-10-15 VITALS — BP 160/98 | HR 81 | Ht 63.0 in | Wt 187.0 lb

## 2010-10-15 DIAGNOSIS — E785 Hyperlipidemia, unspecified: Secondary | ICD-10-CM

## 2010-10-15 DIAGNOSIS — I10 Essential (primary) hypertension: Secondary | ICD-10-CM

## 2010-10-15 DIAGNOSIS — I2510 Atherosclerotic heart disease of native coronary artery without angina pectoris: Secondary | ICD-10-CM

## 2010-10-15 DIAGNOSIS — E039 Hypothyroidism, unspecified: Secondary | ICD-10-CM

## 2010-10-15 NOTE — Patient Instructions (Signed)
Your physician wants you to follow-up in: 1 YEAR.  You will receive a reminder letter in the mail two months in advance. If you don't receive a letter, please call our office to schedule the follow-up appointment.  Your physician recommends that you continue on your current medications as directed. Please refer to the Current Medication list given to you today.  

## 2010-10-24 NOTE — Assessment & Plan Note (Signed)
She is stable and doing well.  Not currently having any symptoms, nor evidence of bleeding.  She has a first generation DES, specifically TAXUS, in the RCA as noted placed in the setting of STEMI.  As such, we have favored continuation of dual antiplatelet therapy in the absence of bleeding risk, and this has been thoroughly discussed.  See overview for outline of her acute procedure.

## 2010-10-24 NOTE — Assessment & Plan Note (Signed)
BP remains somewhat elevated.  Encourage to check BP and give these back to Dr. Kevan Ny for adjustment of meds.

## 2010-10-24 NOTE — Assessment & Plan Note (Signed)
Thyroid replacement managed by Dr. Kevan Ny.

## 2010-10-24 NOTE — Assessment & Plan Note (Signed)
Takes statin and fish oil for secondary prevention.  She is a bit worried about statins.  No problems at present.  Will defer any decisions long term to Dr. Kevan Ny.

## 2010-10-24 NOTE — Progress Notes (Signed)
HPI:  Ebony [name] is in for follow up.  Overall, she is doing pretty well.  Denies any chest pain.  She has been followed by Dr. Kevan Ny, who provides her primary care.  She has remained on dual antiplatelet therapy, and not had significant bleeding.    Current Outpatient Prescriptions  Medication Sig Dispense Refill  . aspirin 81 MG tablet Take 81 mg by mouth daily.        . clopidogrel (PLAVIX) 75 MG tablet Take 1 tablet by mouth Daily.      . fish oil-omega-3 fatty acids 1000 MG capsule Take 1 g by mouth daily.        . fluticasone (FLONASE) 50 MCG/ACT nasal spray as needed.      Marland Kitchen levothyroxine (SYNTHROID, LEVOTHROID) 137 MCG tablet Take 137 mcg by mouth daily.        . pantoprazole (PROTONIX) 40 MG tablet Take 1 tablet by mouth Daily.      . simvastatin (ZOCOR) 40 MG tablet Take 1 tablet by mouth Daily.      . TOPROL XL 200 MG 24 hr tablet Take 0.5 tablets by mouth Daily.      . valsartan (DIOVAN) 320 MG tablet Take 160 mg by mouth daily.        Marland Kitchen VITAMIN D, CHOLECALCIFEROL, PO Take 2,000 Units by mouth daily.          Allergies  Allergen Reactions  . Codeine Phosphate     REACTION: unspecified    Past Medical History  Diagnosis Date  . HYPERTENSION   . HYPERLIPIDEMIA   . CAD   . HYPOTHYROIDISM     Past Surgical History  Procedure Date  . Cesarean section   . Knee operations     No family history on file.  History   Social History  . Marital Status: Married    Spouse Name: N/A    Number of Children: N/A  . Years of Education: N/A   Occupational History  . Not on file.   Social History Main Topics  . Smoking status: Former Games developer  . Smokeless tobacco: Not on file  . Alcohol Use: Not on file  . Drug Use: Not on file  . Sexually Active: Not on file   Other Topics Concern  . Not on file   Social History Narrative  . No narrative on file    ROS: Please see the HPI.  All other systems reviewed and negative.  PHYSICAL EXAM:  BP 160/98  Pulse 81  Ht 5'  3" (1.6 m)  Wt 187 lb (84.823 kg)  BMI 33.13 kg/m2  General: Well developed, well nourished, in no acute distress. Head:  Normocephalic and atraumatic. Neck: no JVD Lungs: Clear to auscultation and percussion. Heart: Normal S1 and S2.  No murmur, rubs or gallops.  Abdomen:  Normal bowel sounds; soft; non tender; no organomegaly Pulses: Pulses normal in all 4 extremities. Extremities: No clubbing or cyanosis. No edema. Neurologic: Alert and oriented x 3.  EKG:  NSR.  Inferior MI, old.  Delay in R wave progression.  No acute changes.   ASSESSMENT AND PLAN:

## 2011-03-13 ENCOUNTER — Other Ambulatory Visit: Payer: Self-pay | Admitting: Obstetrics and Gynecology

## 2011-03-13 DIAGNOSIS — Z1231 Encounter for screening mammogram for malignant neoplasm of breast: Secondary | ICD-10-CM

## 2011-04-25 ENCOUNTER — Ambulatory Visit: Payer: BC Managed Care – PPO

## 2011-05-14 ENCOUNTER — Ambulatory Visit
Admission: RE | Admit: 2011-05-14 | Discharge: 2011-05-14 | Disposition: A | Discharge status: Home / Self Care | Payer: BC Managed Care – PPO | Source: Ambulatory Visit | Attending: Obstetrics and Gynecology | Admitting: Obstetrics and Gynecology

## 2011-05-14 ENCOUNTER — Ambulatory Visit: Payer: BC Managed Care – PPO

## 2011-05-14 DIAGNOSIS — Z1231 Encounter for screening mammogram for malignant neoplasm of breast: Secondary | ICD-10-CM

## 2011-06-05 ENCOUNTER — Encounter: Payer: Self-pay | Admitting: Cardiovascular Disease

## 2011-06-14 ENCOUNTER — Encounter: Payer: Self-pay | Admitting: Cardiovascular Disease

## 2011-10-17 ENCOUNTER — Ambulatory Visit: Payer: BC Managed Care – PPO | Admitting: Cardiology

## 2011-10-30 ENCOUNTER — Ambulatory Visit: Payer: BC Managed Care – PPO | Admitting: Cardiology

## 2011-11-06 ENCOUNTER — Ambulatory Visit (INDEPENDENT_AMBULATORY_CARE_PROVIDER_SITE_OTHER): Payer: Medicare Other | Admitting: Cardiology

## 2011-11-06 ENCOUNTER — Encounter: Payer: Self-pay | Admitting: Cardiology

## 2011-11-06 VITALS — BP 142/80 | HR 85 | Resp 18 | Ht 63.0 in | Wt 191.4 lb

## 2011-11-06 DIAGNOSIS — I10 Essential (primary) hypertension: Secondary | ICD-10-CM

## 2011-11-06 DIAGNOSIS — E785 Hyperlipidemia, unspecified: Secondary | ICD-10-CM

## 2011-11-06 DIAGNOSIS — I2510 Atherosclerotic heart disease of native coronary artery without angina pectoris: Secondary | ICD-10-CM

## 2011-11-06 NOTE — Patient Instructions (Addendum)
Your physician wants you to follow-up in: 1 YEAR with Dr McAlhany.  You will receive a reminder letter in the mail two months in advance. If you don't receive a letter, please call our office to schedule the follow-up appointment.  Your physician recommends that you continue on your current medications as directed. Please refer to the Current Medication list given to you today.  

## 2011-11-12 NOTE — Progress Notes (Signed)
   HPI:  Ebony [name] returns in follow up.  She is not having really any major issues at this point.  Denies any ongoing chest pain at this point.    Current Outpatient Prescriptions  Medication Sig Dispense Refill  . aspirin 81 MG tablet Take 81 mg by mouth daily.        . clopidogrel (PLAVIX) 75 MG tablet Take 1 tablet by mouth Daily.      . fish oil-omega-3 fatty acids 1000 MG capsule Take 1 g by mouth daily.        Marland Kitchen levothyroxine (SYNTHROID, LEVOTHROID) 137 MCG tablet Take 137 mcg by mouth daily.        . pantoprazole (PROTONIX) 40 MG tablet Take 1 tablet by mouth Daily.      . simvastatin (ZOCOR) 40 MG tablet Take 1 tablet by mouth Daily.      . TOPROL XL 200 MG 24 hr tablet Take 0.5 tablets by mouth Daily.      . valsartan (DIOVAN) 320 MG tablet Take 160 mg by mouth daily.        Marland Kitchen VITAMIN D, CHOLECALCIFEROL, PO Take 2,000 Units by mouth daily.        . fluticasone (FLONASE) 50 MCG/ACT nasal spray as needed.        Allergies  Allergen Reactions  . Codeine Phosphate     REACTION: unspecified    Past Medical History  Diagnosis Date  . HYPERTENSION   . HYPERLIPIDEMIA   . CAD   . HYPOTHYROIDISM     Past Surgical History  Procedure Date  . Cesarean section   . Knee operations     No family history on file.  History   Social History  . Marital Status: Married    Spouse Name: N/A    Number of Children: N/A  . Years of Education: N/A   Occupational History  . Not on file.   Social History Main Topics  . Smoking status: Former Games developer  . Smokeless tobacco: Not on file  . Alcohol Use: Not on file  . Drug Use: Not on file  . Sexually Active: Not on file   Other Topics Concern  . Not on file   Social History Narrative  . No narrative on file    ROS: Please see the HPI.  All other systems reviewed and negative.  PHYSICAL EXAM:  BP 142/80  Pulse 85  Resp 18  Ht 5\' 3"  (1.6 m)  Wt 191 lb 6.4 oz (86.818 kg)  BMI 33.90 kg/m2  SpO2 97%  General: Very pleasant  female in no distress.   Head:  Normocephalic and atraumatic. Neck: no JVD Lungs: Clear to auscultation and percussion. Heart: Normal S1 and S2.  No murmur, rubs or gallops.  Abdomen:  Normal bowel sounds; soft; non tender; no organomegaly Pulses: Pulses normal in all 4 extremities. Extremities: No clubbing or cyanosis. No edema. Neurologic: Alert and oriented x 3.  EKG:  NSR.  Delay in  R wave progression.  Inferior MI, old.  Compared to prior tracing, the ECG has worse R wave progression, but this may be related to lead position.    ASSESSMENT AND PLAN:

## 2011-11-23 NOTE — Assessment & Plan Note (Addendum)
The patient has prior inferior MI in 2006 treated with DES.  Her original presentation was tooth pain, but she has done very well.  She has a first generation DES in her RCA, and has safely remained on DAPT.  We have struggled to define the appropriate role for DAPT in DES for STEMI of first gen TAXUS stents in particular.   She will remain on medical therapy as long as bleeding is not an issue.

## 2011-11-23 NOTE — Assessment & Plan Note (Signed)
Managed by primary.  Will try to get results.

## 2011-11-23 NOTE — Assessment & Plan Note (Signed)
Reasonable control. 

## 2012-05-29 ENCOUNTER — Other Ambulatory Visit: Payer: Self-pay

## 2012-05-29 DIAGNOSIS — Z1231 Encounter for screening mammogram for malignant neoplasm of breast: Secondary | ICD-10-CM

## 2012-07-07 ENCOUNTER — Ambulatory Visit: Payer: Medicare Other

## 2012-08-10 ENCOUNTER — Ambulatory Visit: Payer: Medicare Other

## 2012-10-08 ENCOUNTER — Ambulatory Visit
Admission: RE | Admit: 2012-10-08 | Discharge: 2012-10-08 | Disposition: A | Discharge status: Home / Self Care | Payer: Commercial Managed Care - HMO | Source: Ambulatory Visit

## 2012-10-08 DIAGNOSIS — Z1231 Encounter for screening mammogram for malignant neoplasm of breast: Secondary | ICD-10-CM

## 2012-11-11 ENCOUNTER — Ambulatory Visit: Payer: Medicare Other | Admitting: Cardiovascular Disease

## 2012-12-08 ENCOUNTER — Ambulatory Visit (INDEPENDENT_AMBULATORY_CARE_PROVIDER_SITE_OTHER): Payer: Medicare HMO | Admitting: Cardiovascular Disease

## 2012-12-08 ENCOUNTER — Encounter: Payer: Self-pay | Admitting: Cardiovascular Disease

## 2012-12-08 VITALS — BP 152/88 | HR 85 | Ht 63.0 in | Wt 172.1 lb

## 2012-12-08 DIAGNOSIS — E785 Hyperlipidemia, unspecified: Secondary | ICD-10-CM

## 2012-12-08 DIAGNOSIS — I10 Essential (primary) hypertension: Secondary | ICD-10-CM

## 2012-12-08 DIAGNOSIS — I2510 Atherosclerotic heart disease of native coronary artery without angina pectoris: Secondary | ICD-10-CM

## 2012-12-08 NOTE — Patient Instructions (Signed)

## 2012-12-08 NOTE — Progress Notes (Signed)
History of Present Illness: 67 yo female with history of CAD, HTN, HLD, hypothyroidism, DM who is here today for cardiac follow up. She has been followed in the past by Dr. Riley Kill. 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 She was found to have an occluded RCA, treated with a 3.0 x 16 mm Taxus DES. She also had moderate LAD stenosis and mild plaque. Last echo 2008 with LVEF=50-55%.   She is here today for followup. BP is well controlled at home. No chest pain, SOB, palpitations. Feeling well. No lower ext edema.   Primary Care Physician: Dr. Johnella Moloney  Last Lipid Profile: Followed in primary care.   Past Medical History  Diagnosis Date  . HYPERTENSION   . HYPERLIPIDEMIA   . CAD   . HYPOTHYROIDISM   . Diabetes     Past Surgical History  Procedure Laterality Date  . Cesarean section    . Knee operations Bilateral     arthroscopic  . Abdominal hysterectomy      Current Outpatient Prescriptions  Medication Sig Dispense Refill  . aspirin 81 MG tablet Take 81 mg by mouth daily.        . clopidogrel (PLAVIX) 75 MG tablet Take 1 tablet by mouth Daily.      . fish oil-omega-3 fatty acids 1000 MG capsule Take 1 g by mouth daily.        . fluticasone (FLONASE) 50 MCG/ACT nasal spray as needed.      Marland Kitchen levothyroxine (SYNTHROID, LEVOTHROID) 137 MCG tablet Take 137 mcg by mouth daily.        . metFORMIN (GLUCOPHAGE) 500 MG tablet Take 1 1/2 tablets in the morning and 1 1/2 tablets in the evening      . NITROSTAT 0.4 MG SL tablet Place 0.4 mg under the tongue every 5 (five) minutes as needed.       . pantoprazole (PROTONIX) 40 MG tablet Take 1 tablet by mouth Daily.      . simvastatin (ZOCOR) 40 MG tablet Take 1 tablet by mouth Daily.      . TOPROL XL 200 MG 24 hr tablet Take 0.5 tablets by mouth Daily.      . valsartan (DIOVAN) 320 MG tablet Take 160 mg by mouth daily.        Marland Kitchen VITAMIN D, CHOLECALCIFEROL, PO Take 2,000 Units by mouth daily.         No current  facility-administered medications for this visit.    Allergies  Allergen Reactions  . Codeine Phosphate     REACTION: unspecified    History   Social History  . Marital Status: Married    Spouse Name: N/A    Number of Children: 2  . Years of Education: N/A   Occupational History  . Retired    Social History Main Topics  . Smoking status: Former Smoker -- 1.00 packs/day for 15 years    Types: Cigarettes    Quit date: 12/08/1984  . Smokeless tobacco: Not on file  . Alcohol Use: No  . Drug Use: No  . Sexual Activity: Not on file   Other Topics Concern  . Not on file   Social History Narrative  . No narrative on file    Family History  Problem Relation Age of Onset  . CAD Brother   . CAD Brother     Review of Systems:  As stated in the HPI and otherwise negative.   BP 152/88  Pulse 85  Ht 5\' 3"  (1.6 m)  Wt 172 lb 1.9 oz (78.073 kg)  BMI 30.50 kg/m2  Physical Examination: General: Well developed, well nourished, NAD HEENT: OP clear, mucus membranes moist SKIN: warm, dry. No rashes. Neuro: No focal deficits Musculoskeletal: Muscle strength 5/5 all ext Psychiatric: Mood and affect normal Neck: No JVD, no carotid bruits, no thyromegaly, no lymphadenopathy. Lungs:Clear bilaterally, no wheezes, rhonci, crackles Cardiovascular: Regular rate and rhythm. No murmurs, gallops or rubs. Abdomen:Soft. Bowel sounds present. Non-tender.  Extremities: No lower extremity edema. Pulses are 2 + in the bilateral DP/PT.  EKG: NSR, rate 85 bpm.   Assessment and Plan:   1. CAD: Stable. She is on good medical therapy including ASA/Plavix,statin, beta blocker. No recent chest pain concerning for angina. Will arrange echo to assess LV function since she has had no assessment of her LV function since 2008. She will remain on dual anti-platelet therapy with ASA and Plavix since she has a first generation Taxus DES.   2. HTN: BP well controlled at home. No changes today.   3.  Hyperlipidemia: Continue statin.   4. Diabetes  Extensive record review since this is her first visit with me today.

## 2013-01-19 ENCOUNTER — Telehealth: Payer: Self-pay | Admitting: Cardiovascular Disease

## 2013-01-19 ENCOUNTER — Encounter: Payer: Self-pay | Admitting: Cardiology

## 2013-01-19 ENCOUNTER — Ambulatory Visit (HOSPITAL_COMMUNITY): Payer: Medicare HMO | Attending: Cardiology | Admitting: Radiology

## 2013-01-19 ENCOUNTER — Other Ambulatory Visit (HOSPITAL_COMMUNITY): Payer: Self-pay | Admitting: Radiology

## 2013-01-19 DIAGNOSIS — E119 Type 2 diabetes mellitus without complications: Secondary | ICD-10-CM | POA: Insufficient documentation

## 2013-01-19 DIAGNOSIS — E785 Hyperlipidemia, unspecified: Secondary | ICD-10-CM | POA: Insufficient documentation

## 2013-01-19 DIAGNOSIS — I2510 Atherosclerotic heart disease of native coronary artery without angina pectoris: Secondary | ICD-10-CM

## 2013-01-19 DIAGNOSIS — I10 Essential (primary) hypertension: Secondary | ICD-10-CM | POA: Insufficient documentation

## 2013-01-19 DIAGNOSIS — I059 Rheumatic mitral valve disease, unspecified: Secondary | ICD-10-CM | POA: Insufficient documentation

## 2013-01-19 NOTE — Telephone Encounter (Signed)
New Problem:  Pt states she had a missed call. PT is curious if it is the nurse calling with her test results. Pt would like a call back.

## 2013-01-19 NOTE — Progress Notes (Signed)
Echocardiogram performed.  

## 2013-01-19 NOTE — Telephone Encounter (Signed)
Spoke with pt and reviewed echo results with her.  

## 2013-01-25 ENCOUNTER — Other Ambulatory Visit (HOSPITAL_COMMUNITY): Payer: Medicare HMO

## 2013-08-04 ENCOUNTER — Other Ambulatory Visit: Payer: Self-pay | Admitting: Dermatology

## 2013-12-16 ENCOUNTER — Ambulatory Visit (INDEPENDENT_AMBULATORY_CARE_PROVIDER_SITE_OTHER): Payer: Commercial Managed Care - HMO | Admitting: Cardiovascular Disease

## 2013-12-16 ENCOUNTER — Encounter: Payer: Self-pay | Admitting: Cardiovascular Disease

## 2013-12-16 VITALS — BP 160/90 | HR 84 | Ht 63.0 in | Wt 172.0 lb

## 2013-12-16 DIAGNOSIS — E785 Hyperlipidemia, unspecified: Secondary | ICD-10-CM

## 2013-12-16 DIAGNOSIS — I2510 Atherosclerotic heart disease of native coronary artery without angina pectoris: Secondary | ICD-10-CM

## 2013-12-16 DIAGNOSIS — I10 Essential (primary) hypertension: Secondary | ICD-10-CM

## 2013-12-16 MED ORDER — CO-ENZYME Q10 200 MG PO CAPS: 1.0000 | ORAL_CAPSULE | Freq: Every day | ORAL | Status: AC

## 2013-12-16 NOTE — Patient Instructions (Signed)
Your physician wants you to follow-up in:  12 months.  You will receive a reminder letter in the mail two months in advance. If you don't receive a letter, please call our office to schedule the follow-up appointment.  Your physician has recommended you make the following change in your medication:  Start Coenzyme q10 --200 mg by mouth daily.  You can buy this over the counter

## 2013-12-16 NOTE — Progress Notes (Signed)
History of Present Illness: 68 yo female with history of CAD, HTN, HLD, hypothyroidism, DM who is here today for cardiac follow up. She has been followed in the past by Dr. Lia Foyer. 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 She was found to have an occluded RCA, treated with a 3.0 x 16 mm Taxus DES. She also had moderate LAD stenosis and mild plaque. Last echo 01/19/13 with JJHE=17-40%, grade 1 diastolic dysfunction.    She is here today for followup. BP is well controlled at home. No chest pain, SOB, palpitations. Feeling well. No lower ext edema. She does describe cramps in her legs. She thinks this is due to her statin but has been on Zocor for 9 years.   Primary Care Physician: Dr. Mertha Finders  Last Lipid Profile: Followed in primary care.   Past Medical History  Diagnosis Date  . HYPERTENSION   . HYPERLIPIDEMIA   . CAD   . HYPOTHYROIDISM   . Diabetes     Past Surgical History  Procedure Laterality Date  . Cesarean section    . Knee operations Bilateral     arthroscopic  . Abdominal hysterectomy      Current Outpatient Prescriptions  Medication Sig Dispense Refill  . aspirin 81 MG tablet Take 81 mg by mouth daily.      . clopidogrel (PLAVIX) 75 MG tablet Take 1 tablet by mouth Daily.    . fish oil-omega-3 fatty acids 1000 MG capsule Take 1 g by mouth daily.      . fluticasone (FLONASE) 50 MCG/ACT nasal spray as needed.    Marland Kitchen levothyroxine (SYNTHROID, LEVOTHROID) 125 MCG tablet Take 125 mcg by mouth daily before breakfast.    . metFORMIN (GLUCOPHAGE) 500 MG tablet Take 1 1/2 tablets in the morning and 1 1/2 tablets in the evening    . metoprolol (LOPRESSOR) 100 MG tablet Take 100 mg by mouth 2 (two) times daily.    Marland Kitchen NITROSTAT 0.4 MG SL tablet Place 0.4 mg under the tongue every 5 (five) minutes as needed.     . pantoprazole (PROTONIX) 40 MG tablet Take 1 tablet by mouth Daily.    . simvastatin (ZOCOR) 40 MG tablet Take 1 tablet by mouth Daily.    . valsartan (DIOVAN)  320 MG tablet Take 160 mg by mouth daily.      Marland Kitchen VITAMIN D, CHOLECALCIFEROL, PO Take 2,000 Units by mouth daily.       No current facility-administered medications for this visit.    Allergies  Allergen Reactions  . Codeine Phosphate     REACTION: unspecified    History   Social History  . Marital Status: Married    Spouse Name: N/A    Number of Children: 2  . Years of Education: N/A   Occupational History  . Retired    Social History Main Topics  . Smoking status: Former Smoker -- 1.00 packs/day for 15 years    Types: Cigarettes    Quit date: 12/08/1984  . Smokeless tobacco: Not on file  . Alcohol Use: No  . Drug Use: No  . Sexual Activity: Not on file   Other Topics Concern  . Not on file   Social History Narrative    Family History  Problem Relation Age of Onset  . CAD Brother   . CAD Brother     Review of Systems:  As stated in the HPI and otherwise negative.   BP 160/90 mmHg  Pulse 84  Ht 5\' 3"  (1.6 m)  Wt 172 lb (78.019 kg)  BMI 30.48 kg/m2  Physical Examination: General: Well developed, well nourished, NAD HEENT: OP clear, mucus membranes moist SKIN: warm, dry. No rashes. Neuro: No focal deficits Musculoskeletal: Muscle strength 5/5 all ext Psychiatric: Mood and affect normal Neck: No JVD, no carotid bruits, no thyromegaly, no lymphadenopathy. Lungs:Clear bilaterally, no wheezes, rhonci, crackles Cardiovascular: Regular rate and rhythm. No murmurs, gallops or rubs. Abdomen:Soft. Bowel sounds present. Non-tender.  Extremities: No lower extremity edema. Pulses are 2 + in the bilateral DP/PT.  Echo 01/19/13: - Left ventricle: The cavity size was normal. Wall thickness was normal. Systolic function was normal. The estimated ejection fraction was in the range of 50% to 55%. Wall motion was normal; there were no regional wall motion abnormalities. Doppler parameters are consistent with abnormal left ventricular relaxation (grade 1  diastolic dysfunction). - Mitral valve: Calcified annulus.  EKG: NSR, rate 84 bpm. Old inferior infarct.   Assessment and Plan:   1. CAD: Stable. She is on good medical therapy including ASA/Plavix,statin, beta blocker. No recent chest pain concerning for angina. Echo 01/19/13 with normal LV function. She will remain on dual anti-platelet therapy with ASA and Plavix since she has a first generation Taxus DES.   2. HTN: BP elevated today but it is always controlled at home per pt. Will not change medications today. Will continue beta blocker and ARB.   3. Hyperlipidemia: Continue statin. Will add CoQ10 with recent muscles aches. She will try this for one month. If cramps persist, will cut Zocor back in half. If she has continue cramps, will need trial off of statin. Last lipid profile in primary care, not available for my review.   4. Diabetes mellitus: Followed in primary care.

## 2014-03-07 DIAGNOSIS — D509 Iron deficiency anemia, unspecified: Secondary | ICD-10-CM | POA: Diagnosis not present

## 2014-03-08 DIAGNOSIS — J209 Acute bronchitis, unspecified: Secondary | ICD-10-CM | POA: Diagnosis not present

## 2014-03-30 ENCOUNTER — Other Ambulatory Visit: Payer: Self-pay

## 2014-03-30 DIAGNOSIS — Z1231 Encounter for screening mammogram for malignant neoplasm of breast: Secondary | ICD-10-CM

## 2014-04-01 DIAGNOSIS — J209 Acute bronchitis, unspecified: Secondary | ICD-10-CM | POA: Diagnosis not present

## 2014-04-06 ENCOUNTER — Ambulatory Visit
Admission: RE | Admit: 2014-04-06 | Discharge: 2014-04-06 | Disposition: A | Discharge status: Home / Self Care | Payer: Commercial Managed Care - HMO | Source: Ambulatory Visit

## 2014-04-06 ENCOUNTER — Encounter (INDEPENDENT_AMBULATORY_CARE_PROVIDER_SITE_OTHER): Payer: Self-pay

## 2014-04-06 DIAGNOSIS — Z1231 Encounter for screening mammogram for malignant neoplasm of breast: Secondary | ICD-10-CM | POA: Diagnosis not present

## 2014-04-08 DIAGNOSIS — R300 Dysuria: Secondary | ICD-10-CM | POA: Diagnosis not present

## 2014-05-04 DIAGNOSIS — G43909 Migraine, unspecified, not intractable, without status migrainosus: Secondary | ICD-10-CM | POA: Diagnosis not present

## 2014-05-30 DIAGNOSIS — E039 Hypothyroidism, unspecified: Secondary | ICD-10-CM | POA: Diagnosis not present

## 2014-05-30 DIAGNOSIS — D509 Iron deficiency anemia, unspecified: Secondary | ICD-10-CM | POA: Diagnosis not present

## 2014-05-30 DIAGNOSIS — I10 Essential (primary) hypertension: Secondary | ICD-10-CM | POA: Diagnosis not present

## 2014-07-01 DIAGNOSIS — Z0100 Encounter for examination of eyes and vision without abnormal findings: Secondary | ICD-10-CM | POA: Diagnosis not present

## 2014-07-01 DIAGNOSIS — E119 Type 2 diabetes mellitus without complications: Secondary | ICD-10-CM | POA: Diagnosis not present

## 2014-07-21 DIAGNOSIS — Z0001 Encounter for general adult medical examination with abnormal findings: Secondary | ICD-10-CM | POA: Diagnosis not present

## 2014-07-21 DIAGNOSIS — D126 Benign neoplasm of colon, unspecified: Secondary | ICD-10-CM | POA: Diagnosis not present

## 2014-07-21 DIAGNOSIS — D509 Iron deficiency anemia, unspecified: Secondary | ICD-10-CM | POA: Diagnosis not present

## 2014-07-21 DIAGNOSIS — K219 Gastro-esophageal reflux disease without esophagitis: Secondary | ICD-10-CM | POA: Diagnosis not present

## 2014-07-21 DIAGNOSIS — Z1389 Encounter for screening for other disorder: Secondary | ICD-10-CM | POA: Diagnosis not present

## 2014-07-21 DIAGNOSIS — G43909 Migraine, unspecified, not intractable, without status migrainosus: Secondary | ICD-10-CM | POA: Diagnosis not present

## 2014-07-21 DIAGNOSIS — I2510 Atherosclerotic heart disease of native coronary artery without angina pectoris: Secondary | ICD-10-CM | POA: Diagnosis not present

## 2014-07-21 DIAGNOSIS — Z79899 Other long term (current) drug therapy: Secondary | ICD-10-CM | POA: Diagnosis not present

## 2014-07-21 DIAGNOSIS — E1165 Type 2 diabetes mellitus with hyperglycemia: Secondary | ICD-10-CM | POA: Diagnosis not present

## 2014-08-24 ENCOUNTER — Other Ambulatory Visit: Payer: Self-pay | Admitting: Gastroenterology

## 2014-10-17 DIAGNOSIS — J209 Acute bronchitis, unspecified: Secondary | ICD-10-CM | POA: Diagnosis not present

## 2014-10-17 DIAGNOSIS — R300 Dysuria: Secondary | ICD-10-CM | POA: Diagnosis not present

## 2014-11-02 ENCOUNTER — Encounter (HOSPITAL_COMMUNITY): Payer: Self-pay | Admitting: *Deleted

## 2014-11-04 ENCOUNTER — Other Ambulatory Visit: Payer: Self-pay | Admitting: Gastroenterology

## 2014-11-08 SURGERY — COLONOSCOPY WITH PROPOFOL
Anesthesia: Monitor Anesthesia Care

## 2015-01-09 ENCOUNTER — Encounter: Payer: Self-pay | Admitting: *Deleted

## 2015-01-12 ENCOUNTER — Encounter: Payer: Self-pay | Admitting: Cardiovascular Disease

## 2015-01-12 ENCOUNTER — Ambulatory Visit (INDEPENDENT_AMBULATORY_CARE_PROVIDER_SITE_OTHER): Payer: Commercial Managed Care - HMO | Admitting: Cardiovascular Disease

## 2015-01-12 VITALS — BP 130/90 | HR 92 | Ht 63.0 in | Wt 173.0 lb

## 2015-01-12 DIAGNOSIS — E785 Hyperlipidemia, unspecified: Secondary | ICD-10-CM

## 2015-01-12 DIAGNOSIS — I2510 Atherosclerotic heart disease of native coronary artery without angina pectoris: Secondary | ICD-10-CM | POA: Diagnosis not present

## 2015-01-12 DIAGNOSIS — R0600 Dyspnea, unspecified: Secondary | ICD-10-CM

## 2015-01-12 DIAGNOSIS — I10 Essential (primary) hypertension: Secondary | ICD-10-CM

## 2015-01-12 NOTE — Patient Instructions (Signed)
Medication Instructions:  The current medical regimen is effective;  continue present plan and medications.  Testing/Procedures: Your physician has requested that you have a myoview. For further information please visit [email or web address]. Please follow instruction sheet, as given.  Follow-Up: Follow up in 1 year with Dr. Angelena Form.  You will receive a letter in the mail 2 months before you are due.  Please call us when you receive this letter to schedule your follow up appointment.  If you need a refill on your cardiac medications before your next appointment, please call your pharmacy.  Thank you for choosing [facility]!!

## 2015-01-12 NOTE — Progress Notes (Signed)
Chief Complaint  Patient presents with  . Shortness of Breath      History of Present Illness: 69 yo female with history of CAD, HTN, HLD, hypothyroidism, DM who is here today for cardiac follow up. She has been followed in the past by Dr. Lia Foyer. 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 She was found to have an occluded RCA, treated with a 3.0 x 16 mm Taxus DES. She also had moderate LAD stenosis and mild plaque. Last echo 01/19/13 with [id number], grade 1 diastolic dysfunction. When I saw her in November 2015 she c/o leg cramps. CoQ10 was added and her muscle aches resolved.   She is here today for followup. No chest pain, palpitations. She is having dyspnea with exertion. This is worse over the last few months. No lower ext edema. Cramping in legs resolved.   Primary Care Physician: Dr. Mertha Finders  Last Lipid Profile: Followed in primary care.   Past Medical History  Diagnosis Date  . HYPERTENSION   . HYPERLIPIDEMIA   . HYPOTHYROIDISM   . Diabetes ([place])   . CAD     PCI-S, occluded RCA treated with 3.0 x 68mm Taxus DES  . Cystocele with small rectocele and uterine descent     history of     Past Surgical History  Procedure Laterality Date  . Cesarean section      x2  . Knee arthroscopy Bilateral   . Vaginal hysterectomy    . Percutaneous coronary stent intervention (pci-s)      of the RCA with Taxus DES  . Uterine suspension      sling procedure    Current Outpatient Prescriptions  Medication Sig Dispense Refill  . amLODipine (NORVASC) 5 MG tablet Take 2.5 mg by mouth daily.    Marland Kitchen aspirin 81 MG tablet Take 81 mg by mouth daily.      . clopidogrel (PLAVIX) 75 MG tablet Take 1 tablet by mouth Daily.    Marland Kitchen Co-Enzyme Q10 200 MG CAPS Take 1 capsule by mouth daily. 30 capsule 11  . fish oil-omega-3 fatty acids 1000 MG capsule Take 1 g by mouth daily.      . fluticasone (FLONASE) 50 MCG/ACT nasal spray Place 1 spray into both nostrils daily as needed for allergies.      Marland Kitchen levothyroxine (SYNTHROID, LEVOTHROID) 125 MCG tablet Take 125 mcg by mouth daily before breakfast.    . losartan-hydrochlorothiazide (HYZAAR) 100-12.5 MG per tablet Take 1 tablet by mouth daily.    . metFORMIN (GLUCOPHAGE) 500 MG tablet Take 750 mg by mouth 2 (two) times daily.     . metoprolol succinate (TOPROL-XL) 100 MG 24 hr tablet Take 100 mg by mouth daily. Take with or immediately following a meal.    . NITROSTAT 0.4 MG SL tablet Place 0.4 mg under the tongue every 5 (five) minutes as needed for chest pain.     . pantoprazole (PROTONIX) 40 MG tablet Take 1 tablet by mouth Daily.    . simvastatin (ZOCOR) 40 MG tablet Take 1 tablet by mouth Daily.    Marland Kitchen VITAMIN D, CHOLECALCIFEROL, PO Take 2,000 Units by mouth daily.       No current facility-administered medications for this visit.    Allergies  Allergen Reactions  . Codeine Phosphate Nausea And Vomiting  . Macrodantin [Nitrofurantoin] Nausea And Vomiting  . Sulfa Antibiotics Nausea And Vomiting    Social History   Social History  . Marital Status: Married  Spouse Name: N/A  . Number of Children: 2  . Years of Education: N/A   Occupational History  . Retired    Social History Main Topics  . Smoking status: Former Smoker -- 1.00 packs/day for 15 years    Types: Cigarettes    Quit date: 12/08/1984  . Smokeless tobacco: Not on file  . Alcohol Use: No  . Drug Use: No  . Sexual Activity: Not on file   Other Topics Concern  . Not on file   Social History Narrative    Family History  Problem Relation Age of Onset  . CAD Brother   . CAD Brother   . Diabetes Mother   . Other Father     poor circulation  . Healthy Sister   . Healthy Brother     Review of Systems:  As stated in the HPI and otherwise negative.   BP 130/90 mmHg  Pulse 92  Ht 5\' 3"  (1.6 m)  Wt 173 lb (78.472 kg)  BMI 30.65 kg/m2  SpO2 99%  Physical Examination: General: Well developed, well nourished, NAD HEENT: OP clear, mucus membranes  moist SKIN: warm, dry. No rashes. Neuro: No focal deficits Musculoskeletal: Muscle strength 5/5 all ext Psychiatric: Mood and affect normal Neck: No JVD, no carotid bruits, no thyromegaly, no lymphadenopathy. Lungs:Clear bilaterally, no wheezes, rhonci, crackles Cardiovascular: Regular rate and rhythm. No murmurs, gallops or rubs. Abdomen:Soft. Bowel sounds present. Non-tender.  Extremities: No lower extremity edema. Pulses are 2 + in the bilateral DP/PT.  Echo 01/19/13: - Left ventricle: The cavity size was normal. Wall thickness was normal. Systolic function was normal. The estimated ejection fraction was in the range of 50% to 55%. Wall motion was normal; there were no regional wall motion abnormalities. Doppler parameters are consistent with abnormal left ventricular relaxation (grade 1 diastolic dysfunction). - Mitral valve: Calcified annulus.  EKG:  EKG is ordered today. The ekg ordered today demonstrates NSR, rate 92 bpm. Poor R wave progression precordial leads.   Recent Labs: No results found for requested labs within last 365 days.   Lipid Panel No results found for: CHOL, TRIG, HDL, CHOLHDL, VLDL, LDLCALC, LDLDIRECT   Wt Readings from Last 3 Encounters:  01/12/15 173 lb (78.472 kg)  12/16/13 172 lb (78.019 kg)  12/08/12 172 lb 1.9 oz (78.073 kg)     Other studies Reviewed: Additional studies/ records that were reviewed today include: . Review of the above records demonstrates:    Assessment and Plan:   1. CAD: No chest pain but she has had some dyspnea with exertion. She is on good medical therapy including ASA/Plavix,statin, beta blocker. Echo 01/19/13 with normal LV function. She will remain on dual anti-platelet therapy with ASA and Plavix since she has a first generation Taxus DES. Given her dyspnea, will arrange exercise nuclear stress test.   2. HTN: BP controlled. NO changes. Will continue beta blocker and ARB/HCTZ.   3. Hyperlipidemia:  Continue statin. Muscle aches better on CoQ10.   4. Diabetes mellitus: Followed in primary care.   Current medicines are reviewed at length with the patient today.  The patient does not have concerns regarding medicines.  The following changes have been made:  no change  Labs/ tests ordered today include:   Orders Placed This Encounter  Procedures  . Myocardial Perfusion Imaging  . EKG 12-Lead    Disposition:   FU with me in 12  months  Signed, Lauree Chandler, MD 01/12/2015 10:54 AM    [facility] [address], [place], [place]  [address] Phone: [phone]; Fax: [phone]

## 2015-01-18 DIAGNOSIS — I2510 Atherosclerotic heart disease of native coronary artery without angina pectoris: Secondary | ICD-10-CM | POA: Diagnosis not present

## 2015-01-18 DIAGNOSIS — I10 Essential (primary) hypertension: Secondary | ICD-10-CM | POA: Diagnosis not present

## 2015-01-18 DIAGNOSIS — E1165 Type 2 diabetes mellitus with hyperglycemia: Secondary | ICD-10-CM | POA: Diagnosis not present

## 2015-01-18 DIAGNOSIS — D509 Iron deficiency anemia, unspecified: Secondary | ICD-10-CM | POA: Diagnosis not present

## 2015-01-31 ENCOUNTER — Ambulatory Visit (HOSPITAL_COMMUNITY)
Admission: RE | Admit: 2015-01-31 | Payer: Commercial Managed Care - HMO | Source: Ambulatory Visit | Admitting: Gastroenterology

## 2015-01-31 SURGERY — COLONOSCOPY WITH PROPOFOL
Anesthesia: Monitor Anesthesia Care

## 2015-02-24 DIAGNOSIS — J209 Acute bronchitis, unspecified: Secondary | ICD-10-CM | POA: Diagnosis not present

## 2015-02-24 DIAGNOSIS — J329 Chronic sinusitis, unspecified: Secondary | ICD-10-CM | POA: Diagnosis not present

## 2015-03-06 ENCOUNTER — Ambulatory Visit (HOSPITAL_COMMUNITY): Payer: Commercial Managed Care - HMO | Attending: Cardiovascular Disease

## 2015-03-06 DIAGNOSIS — I2510 Atherosclerotic heart disease of native coronary artery without angina pectoris: Secondary | ICD-10-CM | POA: Diagnosis not present

## 2015-03-06 DIAGNOSIS — I10 Essential (primary) hypertension: Secondary | ICD-10-CM | POA: Insufficient documentation

## 2015-03-06 DIAGNOSIS — R0600 Dyspnea, unspecified: Secondary | ICD-10-CM | POA: Insufficient documentation

## 2015-03-06 DIAGNOSIS — E119 Type 2 diabetes mellitus without complications: Secondary | ICD-10-CM | POA: Diagnosis not present

## 2015-03-06 DIAGNOSIS — R9439 Abnormal result of other cardiovascular function study: Secondary | ICD-10-CM | POA: Insufficient documentation

## 2015-03-06 DIAGNOSIS — R0609 Other forms of dyspnea: Secondary | ICD-10-CM | POA: Insufficient documentation

## 2015-03-06 DIAGNOSIS — R0602 Shortness of breath: Secondary | ICD-10-CM | POA: Diagnosis not present

## 2015-03-06 LAB — MYOCARDIAL PERFUSION IMAGING
CHL CUP RESTING HR STRESS: 98 {beats}/min
CSEPED: 6 min
CSEPEDS: 0 s
CSEPPHR: 137 {beats}/min
Estimated workload: 4.6 METS
LV dias vol: 60 mL
LVSYSVOL: 30 mL
MPHR: 152 {beats}/min
NUC STRESS TID: 1.01
Percent HR: 90 %
RATE: 0.45
SDS: 1
SRS: 16
SSS: 17

## 2015-03-06 MED ORDER — TECHNETIUM TC 99M SESTAMIBI GENERIC - CARDIOLITE
10.7000 | Freq: Once | INTRAVENOUS | Status: AC | PRN
  Administered 2015-03-06: 11 via INTRAVENOUS

## 2015-03-06 MED ORDER — TECHNETIUM TC 99M SESTAMIBI GENERIC - CARDIOLITE
32.1000 | Freq: Once | INTRAVENOUS | Status: AC | PRN
  Administered 2015-03-06: 32.1 via INTRAVENOUS

## 2015-03-07 ENCOUNTER — Other Ambulatory Visit: Payer: Self-pay

## 2015-03-07 ENCOUNTER — Telehealth: Payer: Self-pay | Admitting: Cardiovascular Disease

## 2015-03-07 DIAGNOSIS — Z1231 Encounter for screening mammogram for malignant neoplasm of breast: Secondary | ICD-10-CM

## 2015-03-07 NOTE — Telephone Encounter (Signed)
Spoke with patient about recent myoview results. 

## 2015-03-07 NOTE — Telephone Encounter (Signed)
Follow Up ° °Pt returned call//  °

## 2015-04-12 DIAGNOSIS — R350 Frequency of micturition: Secondary | ICD-10-CM | POA: Diagnosis not present

## 2015-04-12 DIAGNOSIS — N390 Urinary tract infection, site not specified: Secondary | ICD-10-CM | POA: Diagnosis not present

## 2015-04-19 ENCOUNTER — Ambulatory Visit
Admission: RE | Admit: 2015-04-19 | Discharge: 2015-04-19 | Disposition: A | Discharge status: Home / Self Care | Payer: Commercial Managed Care - HMO | Source: Ambulatory Visit

## 2015-04-19 DIAGNOSIS — Z1231 Encounter for screening mammogram for malignant neoplasm of breast: Secondary | ICD-10-CM

## 2015-04-27 DIAGNOSIS — R300 Dysuria: Secondary | ICD-10-CM | POA: Diagnosis not present

## 2015-05-11 DIAGNOSIS — N390 Urinary tract infection, site not specified: Secondary | ICD-10-CM | POA: Diagnosis not present

## 2015-05-11 DIAGNOSIS — R300 Dysuria: Secondary | ICD-10-CM | POA: Diagnosis not present

## 2015-05-11 DIAGNOSIS — Z6832 Body mass index (BMI) 32.0-32.9, adult: Secondary | ICD-10-CM | POA: Diagnosis not present

## 2015-05-12 DIAGNOSIS — N390 Urinary tract infection, site not specified: Secondary | ICD-10-CM | POA: Diagnosis not present

## 2015-05-16 ENCOUNTER — Inpatient Hospital Stay (HOSPITAL_COMMUNITY)
Admission: EM | Admit: 2015-05-16 | Discharge: 2015-05-18 | DRG: 872 | Disposition: A | Discharge status: Home / Self Care | Payer: Commercial Managed Care - HMO | Attending: Internal Medicine | Admitting: Internal Medicine

## 2015-05-16 ENCOUNTER — Emergency Department (HOSPITAL_COMMUNITY): Payer: Commercial Managed Care - HMO

## 2015-05-16 ENCOUNTER — Encounter (HOSPITAL_COMMUNITY): Payer: Self-pay | Admitting: Emergency Medicine

## 2015-05-16 DIAGNOSIS — Z881 Allergy status to other antibiotic agents status: Secondary | ICD-10-CM

## 2015-05-16 DIAGNOSIS — Z8249 Family history of ischemic heart disease and other diseases of the circulatory system: Secondary | ICD-10-CM | POA: Diagnosis not present

## 2015-05-16 DIAGNOSIS — E785 Hyperlipidemia, unspecified: Secondary | ICD-10-CM | POA: Diagnosis not present

## 2015-05-16 DIAGNOSIS — I2510 Atherosclerotic heart disease of native coronary artery without angina pectoris: Secondary | ICD-10-CM | POA: Diagnosis present

## 2015-05-16 DIAGNOSIS — Z955 Presence of coronary angioplasty implant and graft: Secondary | ICD-10-CM | POA: Diagnosis not present

## 2015-05-16 DIAGNOSIS — E119 Type 2 diabetes mellitus without complications: Secondary | ICD-10-CM

## 2015-05-16 DIAGNOSIS — Z885 Allergy status to narcotic agent status: Secondary | ICD-10-CM | POA: Diagnosis not present

## 2015-05-16 DIAGNOSIS — Z7902 Long term (current) use of antithrombotics/antiplatelets: Secondary | ICD-10-CM | POA: Diagnosis not present

## 2015-05-16 DIAGNOSIS — Z7982 Long term (current) use of aspirin: Secondary | ICD-10-CM

## 2015-05-16 DIAGNOSIS — Z87440 Personal history of urinary (tract) infections: Secondary | ICD-10-CM | POA: Diagnosis not present

## 2015-05-16 DIAGNOSIS — Z7984 Long term (current) use of oral hypoglycemic drugs: Secondary | ICD-10-CM

## 2015-05-16 DIAGNOSIS — A419 Sepsis, unspecified organism: Secondary | ICD-10-CM | POA: Diagnosis not present

## 2015-05-16 DIAGNOSIS — E039 Hypothyroidism, unspecified: Secondary | ICD-10-CM | POA: Diagnosis not present

## 2015-05-16 DIAGNOSIS — R05 Cough: Secondary | ICD-10-CM | POA: Diagnosis not present

## 2015-05-16 DIAGNOSIS — Z87891 Personal history of nicotine dependence: Secondary | ICD-10-CM | POA: Diagnosis not present

## 2015-05-16 DIAGNOSIS — Z79899 Other long term (current) drug therapy: Secondary | ICD-10-CM

## 2015-05-16 DIAGNOSIS — E1165 Type 2 diabetes mellitus with hyperglycemia: Secondary | ICD-10-CM | POA: Diagnosis present

## 2015-05-16 DIAGNOSIS — R509 Fever, unspecified: Secondary | ICD-10-CM | POA: Diagnosis not present

## 2015-05-16 DIAGNOSIS — I10 Essential (primary) hypertension: Secondary | ICD-10-CM | POA: Diagnosis present

## 2015-05-16 DIAGNOSIS — A4151 Sepsis due to Escherichia coli [E. coli]: Principal | ICD-10-CM | POA: Diagnosis present

## 2015-05-16 DIAGNOSIS — Z833 Family history of diabetes mellitus: Secondary | ICD-10-CM

## 2015-05-16 DIAGNOSIS — N390 Urinary tract infection, site not specified: Secondary | ICD-10-CM | POA: Diagnosis present

## 2015-05-16 DIAGNOSIS — B9629 Other Escherichia coli [E. coli] as the cause of diseases classified elsewhere: Secondary | ICD-10-CM | POA: Diagnosis not present

## 2015-05-16 LAB — BASIC METABOLIC PANEL
ANION GAP: 15 (ref 5–15)
BUN: 14 mg/dL (ref 6–20)
CHLORIDE: 105 mmol/L (ref 101–111)
CO2: 18 mmol/L — ABNORMAL LOW (ref 22–32)
Calcium: 9.5 mg/dL (ref 8.9–10.3)
Creatinine, Ser: 0.95 mg/dL (ref 0.44–1.00)
GFR calc Af Amer: >60 mL/min (ref >60)
GFR calc non Af Amer: >60 mL/min (ref >60)
Glucose, Bld: 197 mg/dL — ABNORMAL HIGH (ref 65–99)
POTASSIUM: 3.8 mmol/L (ref 3.5–5.1)
SODIUM: 138 mmol/L (ref 135–145)

## 2015-05-16 LAB — CBC
HEMATOCRIT: 40.8 % (ref 36.0–46.0)
HEMOGLOBIN: 13.9 g/dL (ref 12.0–15.0)
MCH: 30.3 pg (ref 26.0–34.0)
MCHC: 34.1 g/dL (ref 30.0–36.0)
MCV: 89.1 fL (ref 78.0–100.0)
Platelets: 219 10*3/uL (ref 150–400)
RBC: 4.58 MIL/uL (ref 3.87–5.11)
RDW: 13.2 % (ref 11.5–15.5)
WBC: 9.9 10*3/uL (ref 4.0–10.5)

## 2015-05-16 LAB — HEPATIC FUNCTION PANEL
ALBUMIN: 4.1 g/dL (ref 3.5–5.0)
ALK PHOS: 89 U/L (ref 38–126)
ALT: 31 U/L (ref 14–54)
AST: 29 U/L (ref 15–41)
BILIRUBIN INDIRECT: 0.5 mg/dL (ref 0.3–0.9)
Bilirubin, Direct: 0.2 mg/dL (ref 0.1–0.5)
TOTAL PROTEIN: 7.7 g/dL (ref 6.5–8.1)
Total Bilirubin: 0.7 mg/dL (ref 0.3–1.2)

## 2015-05-16 LAB — URINALYSIS, ROUTINE W REFLEX MICROSCOPIC
Bilirubin Urine: NEGATIVE
Glucose, UA: NEGATIVE mg/dL
Ketones, ur: NEGATIVE mg/dL
NITRITE: NEGATIVE
PROTEIN: NEGATIVE mg/dL
SPECIFIC GRAVITY, URINE: 1.012 (ref 1.005–1.030)
pH: 5.5 (ref 5.0–8.0)

## 2015-05-16 LAB — URINE MICROSCOPIC-ADD ON

## 2015-05-16 LAB — I-STAT CG4 LACTIC ACID, ED: Lactic Acid, Venous: 2.74 mmol/L (ref 0.5–2.0)

## 2015-05-16 MED ORDER — ASPIRIN EC 81 MG PO TBEC
81.0000 mg | DELAYED_RELEASE_TABLET | Freq: Every day | ORAL | Status: DC
  Administered 2015-05-17 – 2015-05-18 (×2): 81 mg via ORAL
  Filled 2015-05-16 (×2): qty 1

## 2015-05-16 MED ORDER — DEXTROSE 5 % IV SOLN
500.0000 mg | Freq: Once | INTRAVENOUS | Status: AC
  Administered 2015-05-16: 500 mg via INTRAVENOUS
  Filled 2015-05-16: qty 500

## 2015-05-16 MED ORDER — ENOXAPARIN SODIUM 40 MG/0.4ML ~~LOC~~ SOLN
40.0000 mg | SUBCUTANEOUS | Status: DC
  Administered 2015-05-16 – 2015-05-17 (×2): 40 mg via SUBCUTANEOUS
  Filled 2015-05-16 (×2): qty 0.4

## 2015-05-16 MED ORDER — CLOPIDOGREL BISULFATE 75 MG PO TABS
75.0000 mg | ORAL_TABLET | Freq: Every day | ORAL | Status: DC
  Administered 2015-05-17 – 2015-05-18 (×2): 75 mg via ORAL
  Filled 2015-05-16 (×2): qty 1

## 2015-05-16 MED ORDER — SODIUM CHLORIDE 0.9 % IV BOLUS (SEPSIS)
1000.0000 mL | INTRAVENOUS | Status: AC
  Administered 2015-05-16 (×2): 1000 mL via INTRAVENOUS

## 2015-05-16 MED ORDER — FLUTICASONE PROPIONATE 50 MCG/ACT NA SUSP
1.0000 | Freq: Every day | NASAL | Status: DC | PRN
  Filled 2015-05-16: qty 16

## 2015-05-16 MED ORDER — INSULIN ASPART 100 UNIT/ML ~~LOC~~ SOLN
0.0000 [IU] | Freq: Three times a day (TID) | SUBCUTANEOUS | Status: DC
  Administered 2015-05-17 (×3): 1 [IU] via SUBCUTANEOUS
  Administered 2015-05-18 (×2): 2 [IU] via SUBCUTANEOUS

## 2015-05-16 MED ORDER — AZO CRANBERRY 250-30 MG PO TABS: 1.0000 | ORAL_TABLET | Freq: Two times a day (BID) | ORAL | Status: DC

## 2015-05-16 MED ORDER — DEXTROSE 5 % IV SOLN: 1.0000 g | INTRAVENOUS | Status: DC

## 2015-05-16 MED ORDER — IRBESARTAN 150 MG PO TABS
150.0000 mg | ORAL_TABLET | Freq: Every day | ORAL | Status: DC
  Administered 2015-05-17 – 2015-05-18 (×2): 150 mg via ORAL
  Filled 2015-05-16 (×2): qty 1

## 2015-05-16 MED ORDER — METFORMIN HCL 500 MG PO TABS
500.0000 mg | ORAL_TABLET | Freq: Two times a day (BID) | ORAL | Status: DC
  Administered 2015-05-17 – 2015-05-18 (×3): 500 mg via ORAL
  Filled 2015-05-16 (×4): qty 1

## 2015-05-16 MED ORDER — SIMVASTATIN 40 MG PO TABS
40.0000 mg | ORAL_TABLET | Freq: Every day | ORAL | Status: DC
  Administered 2015-05-16: 40 mg via ORAL
  Filled 2015-05-16 (×2): qty 1

## 2015-05-16 MED ORDER — METFORMIN HCL 500 MG PO TABS
500.0000 mg | ORAL_TABLET | Freq: Once | ORAL | Status: AC
  Administered 2015-05-16: 500 mg via ORAL
  Filled 2015-05-16: qty 1

## 2015-05-16 MED ORDER — SODIUM CHLORIDE 0.9 % IV SOLN
INTRAVENOUS | Status: DC
  Administered 2015-05-17 (×3): via INTRAVENOUS

## 2015-05-16 MED ORDER — AMLODIPINE BESYLATE 5 MG PO TABS
5.0000 mg | ORAL_TABLET | Freq: Every day | ORAL | Status: DC
  Administered 2015-05-17 – 2015-05-18 (×2): 5 mg via ORAL
  Filled 2015-05-16 (×2): qty 1

## 2015-05-16 MED ORDER — ACETAMINOPHEN 325 MG PO TABS: 650.0000 mg | ORAL_TABLET | Freq: Four times a day (QID) | ORAL | Status: DC | PRN

## 2015-05-16 MED ORDER — NITROGLYCERIN 0.4 MG SL SUBL: 0.4000 mg | SUBLINGUAL_TABLET | SUBLINGUAL | Status: DC | PRN

## 2015-05-16 MED ORDER — ACETAMINOPHEN 650 MG RE SUPP: 650.0000 mg | Freq: Four times a day (QID) | RECTAL | Status: DC | PRN

## 2015-05-16 MED ORDER — DEXTROSE 5 % IV SOLN: 500.0000 mg | INTRAVENOUS | Status: DC

## 2015-05-16 MED ORDER — SODIUM CHLORIDE 0.9 % IV BOLUS (SEPSIS)
500.0000 mL | INTRAVENOUS | Status: AC
  Administered 2015-05-17: 500 mL via INTRAVENOUS

## 2015-05-16 MED ORDER — ONDANSETRON HCL 4 MG/2ML IJ SOLN: 4.0000 mg | Freq: Four times a day (QID) | INTRAMUSCULAR | Status: DC | PRN

## 2015-05-16 MED ORDER — DEXTROSE 5 % IV SOLN
1.0000 g | Freq: Once | INTRAVENOUS | Status: AC
  Administered 2015-05-16: 1 g via INTRAVENOUS
  Filled 2015-05-16: qty 10

## 2015-05-16 MED ORDER — DEXTROSE 5 % IV SOLN
1.0000 g | INTRAVENOUS | Status: DC
  Administered 2015-05-17: 1 g via INTRAVENOUS
  Filled 2015-05-16 (×2): qty 10

## 2015-05-16 MED ORDER — LEVOTHYROXINE SODIUM 125 MCG PO TABS
125.0000 ug | ORAL_TABLET | Freq: Every day | ORAL | Status: DC
  Administered 2015-05-17 – 2015-05-18 (×2): 125 ug via ORAL
  Filled 2015-05-16 (×2): qty 1

## 2015-05-16 MED ORDER — SODIUM CHLORIDE 0.9% FLUSH
3.0000 mL | Freq: Two times a day (BID) | INTRAVENOUS | Status: DC
  Administered 2015-05-16 – 2015-05-17 (×2): 3 mL via INTRAVENOUS

## 2015-05-16 MED ORDER — OMEGA-3-ACID ETHYL ESTERS 1 G PO CAPS
1.0000 g | ORAL_CAPSULE | Freq: Every day | ORAL | Status: DC
  Administered 2015-05-17 – 2015-05-18 (×2): 1 g via ORAL
  Filled 2015-05-16 (×2): qty 1

## 2015-05-16 MED ORDER — FERROUS SULFATE 325 (65 FE) MG PO TABS
325.0000 mg | ORAL_TABLET | Freq: Every day | ORAL | Status: DC
  Administered 2015-05-17 – 2015-05-18 (×2): 325 mg via ORAL
  Filled 2015-05-16 (×2): qty 1

## 2015-05-16 MED ORDER — METOPROLOL SUCCINATE ER 100 MG PO TB24
100.0000 mg | ORAL_TABLET | Freq: Every day | ORAL | Status: DC
  Administered 2015-05-17 – 2015-05-18 (×2): 100 mg via ORAL
  Filled 2015-05-16 (×2): qty 1

## 2015-05-16 MED ORDER — PANTOPRAZOLE SODIUM 40 MG PO TBEC
40.0000 mg | DELAYED_RELEASE_TABLET | Freq: Every day | ORAL | Status: DC
Start: 2015-05-17 — End: 2015-05-18
  Administered 2015-05-17 – 2015-05-18 (×2): 40 mg via ORAL
  Filled 2015-05-16 (×3): qty 1

## 2015-05-16 MED ORDER — ONDANSETRON HCL 4 MG PO TABS: 4.0000 mg | ORAL_TABLET | Freq: Four times a day (QID) | ORAL | Status: DC | PRN

## 2015-05-16 NOTE — ED Notes (Signed)
Admitting MD at bedside.

## 2015-05-16 NOTE — ED Notes (Signed)
Bed: [id number] Expected date:  Expected time:  Means of arrival:  Comments: EMS- 70yo F, fever/UTI?

## 2015-05-16 NOTE — Progress Notes (Signed)
Pharmacy Antibiotic Note  Ebony [name] is a 70 y.o. female admitted on 05/16/2015 with CAP.  Pharmacy has been consulted for rocephin/zmax dosing.  Plan: Rocephin 1gm IV q24h Zmax 500mg  IV q24h Rx will siqn off - no further adjustments needed  Weight: 173 lb (78.472 kg)  Temp (24hrs), Avg:100.8 F (38.2 C), Min:100.8 F (38.2 C), Max:100.8 F (38.2 C)  No results for input(s): WBC, CREATININE, LATICACIDVEN, VANCOTROUGH, VANCOPEAK, VANCORANDOM, GENTTROUGH, GENTPEAK, GENTRANDOM, TOBRATROUGH, TOBRAPEAK, TOBRARND, AMIKACINPEAK, AMIKACINTROU, AMIKACIN in the last 168 hours.  CrCl cannot be calculated (Patient has no serum creatinine result on file.).    Allergies  Allergen Reactions  . Codeine Phosphate Nausea And Vomiting  . Macrodantin [Nitrofurantoin] Nausea And Vomiting    Antimicrobials this admission: 4/18 rocephin >>  4/18 zmax >>   Dose adjustments this admission:   Microbiology results:  BCx:   UCx:    Sputum:    MRSA PCR:   Thank you for allowing pharmacy to be a part of this patient's care.  Dorrene German 05/16/2015 6:53 PM

## 2015-05-16 NOTE — H&P (Signed)
[facility] History and Physical  Ebony [name] [id number] DOB: [birth year] DOA: 05/16/2015  Referring physician: Monico Blitz, PA-C PCP: Henrine Screws, MD   Chief Complaint: Fever  HPI: Ebony [name] is a 70 y.o. female with a past medical history of CAD, status post stenting of the RCA, hypertension, hyperlipidemia, hypothyroidism, type 2 diabetes is coming to the emergency department with complaints of fever since earlier today.  Per patient, since February, she has been dealing with UTI symptoms and has been treated with at least 5 different antibiotics (ciprofloxacin and doxycycline, rifampin, cephalexin and most recently Bactrim). She was seen in [place] last week, and just returned to the area today. She was called today from [place] with cultures results and requests to switch her antibiotic to Levaquin. However, the patient states that once she got home, she felt very chilled and her temperature was 103F so she decided to come to the ER. She states that for the past 2 months, she has been having dysuria, frequency and suprapubic tenderness as well. She states that she had right flank pain recently and earlier today. She denies vaginal discharge.  When seen in the emergency department, she stated that she was feeling better. She denies headache, cough, sore throat, chest pain, dyspnea, diarrhea. Workup reveals abnormal UA consistent with UTI and hyperglycemia.   Review of Systems:  Constitutional:  Positive, Fevers, chills, fatigue.  No weight loss, night sweats HEENT:  No headaches, Difficulty swallowing,Tooth/dental problems,Sore throat,  No sneezing, itching, ear ache, nasal congestion, post nasal drip,  Cardio-vascular:  No chest pain, Orthopnea, PND, swelling in lower extremities, anasarca, dizziness, palpitations  GI:  Positive nausea, decreased appetite.  No heartburn, indigestion, abdominal pain,  vomiting, diarrhea, change in bowel  habits. Resp:  No shortness of breath with exertion or at rest. No excess mucus, no productive cough, No non-productive cough, No coughing up of blood.No change in color of mucus.No wheezing.No chest wall deformity  Skin:  no rash or lesions.  GU:  Positive dysuria, or urgency and right flank pain earlier. Musculoskeletal:  Occasional myalgias and arthralgias.. No back pain.  Psych:  No change in mood or affect. No depression or anxiety. No memory loss.   Past Medical History  Diagnosis Date  . HYPERTENSION   . HYPERLIPIDEMIA   . HYPOTHYROIDISM   . Diabetes ([place])   . CAD     PCI-S, occluded RCA treated with 3.0 x 29mm Taxus DES  . Cystocele with small rectocele and uterine descent     history of    Past Surgical History  Procedure Laterality Date  . Cesarean section      x2  . Knee arthroscopy Bilateral   . Vaginal hysterectomy    . Percutaneous coronary stent intervention (pci-s)      of the RCA with Taxus DES  . Uterine suspension      sling procedure   Social History:  reports that she quit smoking about 30 years ago. Her smoking use included Cigarettes. She has a 15 pack-year smoking history. She does not have any smokeless tobacco history on file. She reports that she does not drink alcohol or use illicit drugs.  Allergies  Allergen Reactions  . Codeine Phosphate Nausea And Vomiting  . Macrodantin [Nitrofurantoin] Nausea And Vomiting    Family History  Problem Relation Age of Onset  . CAD Brother   . CAD Brother   . Diabetes Mother   . Other Father     poor circulation  .  Healthy Sister   . Healthy Brother     Prior to Admission medications   Medication Sig Start Date End Date Taking? Authorizing Provider  amLODipine (NORVASC) 5 MG tablet Take 5 mg by mouth daily.   Yes Historical Provider, MD  aspirin 81 MG tablet Take 81 mg by mouth daily.     Yes Historical Provider, MD  clopidogrel (PLAVIX) 75 MG tablet Take 1 tablet by mouth Daily. 08/24/10  Yes  Historical Provider, MD  Co-Enzyme Q10 200 MG CAPS Take 1 capsule by mouth daily. 12/16/13  Yes Burnell Blanks, MD  Cranberry-Vitamin C-Probiotic (AZO CRANBERRY) 250-30 MG TABS Take 1 tablet by mouth 2 (two) times daily.   Yes Historical Provider, MD  ferrous sulfate 325 (65 FE) MG EC tablet Take 325 mg by mouth daily with breakfast.   Yes Historical Provider, MD  fish oil-omega-3 fatty acids 1000 MG capsule Take 1 g by mouth daily.     Yes Historical Provider, MD  levothyroxine (SYNTHROID, LEVOTHROID) 125 MCG tablet Take 125 mcg by mouth daily before breakfast.   Yes Historical Provider, MD  metFORMIN (GLUCOPHAGE) 500 MG tablet Take 500 mg by mouth 2 (two) times daily.  10/26/12  Yes Historical Provider, MD  metoprolol succinate (TOPROL-XL) 100 MG 24 hr tablet Take 100 mg by mouth daily. Take with or immediately following a meal.   Yes Historical Provider, MD  pantoprazole (PROTONIX) 40 MG tablet Take 1 tablet by mouth Daily. 08/13/10  Yes Historical Provider, MD  simvastatin (ZOCOR) 40 MG tablet Take 1 tablet by mouth Daily. 08/13/10  Yes Historical Provider, MD  sulfamethoxazole-trimethoprim (BACTRIM DS,SEPTRA DS) 800-160 MG tablet Take 1 tablet by mouth 2 (two) times daily. ABT Start Date 05/10/15 & End Date 05/17/15. 05/11/15  Yes Historical Provider, MD  valsartan (DIOVAN) 160 MG tablet Take 160 mg by mouth daily.   Yes Historical Provider, MD  VITAMIN D, CHOLECALCIFEROL, PO Take 2,000 Units by mouth daily.     Yes Historical Provider, MD  fluticasone (FLONASE) 50 MCG/ACT nasal spray Place 1 spray into both nostrils daily as needed for allergies.  09/13/10   Historical Provider, MD  NITROSTAT 0.4 MG SL tablet Place 0.4 mg under the tongue every 5 (five) minutes as needed for chest pain.  09/17/12   Historical Provider, MD   Physical Exam: Filed Vitals:   05/16/15 1842 05/16/15 1940 05/16/15 2000 05/16/15 2030  BP:   132/75 134/74  Pulse:   97 91  Temp:  99.5 F (37.5 C)    TempSrc:  Oral     Resp:   20 20  Weight: 78.472 kg (173 lb)     SpO2:   96% 96%    Wt Readings from Last 3 Encounters:  05/16/15 78.472 kg (173 lb)  01/12/15 78.472 kg (173 lb)  12/16/13 78.019 kg (172 lb)    General:  Appears calm and comfortable Eyes: PERRL, normal lids, irises & conjunctiva ENT: grossly normal hearing, lips & tongue. Oral mucosa is mildly dry. Neck: no LAD, masses or thyromegaly Cardiovascular: RRR, no m/r/g. No LE edema. Telemetry: SR, no arrhythmias  Respiratory: CTA bilaterally, no w/r/r. Normal respiratory effort. Abdomen: BS+, soft, nontender. Skin: no rash or induration seen on limited exam Musculoskeletal: grossly normal tone BUE/BLE Psychiatric: grossly normal mood and affect, speech fluent and appropriate Neurologic: Awake, alert, oriented 4, grossly non-focal.          Labs on Admission:  Basic Metabolic Panel:  Recent Labs Lab 05/16/15 1903  NA 138  K 3.8  CL 105  CO2 18*  GLUCOSE 197*  BUN 14  CREATININE 0.95  CALCIUM 9.5   Liver Function Tests:  Recent Labs Lab 05/16/15 1903  AST 29  ALT 31  ALKPHOS 89  BILITOT 0.7  PROT 7.7  ALBUMIN 4.1   CBC:  Recent Labs Lab 05/16/15 1903  WBC 9.9  HGB 13.9  HCT 40.8  MCV 89.1  PLT 219    Radiological Exams on Admission: Dg Chest 2 View  05/16/2015  CLINICAL DATA:  Cough.  Fever. EXAM: CHEST  2 VIEW COMPARISON:  May 11, 2004. FINDINGS: The heart size and mediastinal contours are within normal limits. Both lungs are clear. No pneumothorax or pleural effusion is noted. The visualized skeletal structures are unremarkable. IMPRESSION: No active cardiopulmonary disease. Electronically Signed   By: Marijo Conception, M.D.   On: 05/16/2015 19:45    Assessment/Plan Principal Problem:   Sepsis secondary to UTI [facility])   UTI (urinary tract infection) Admit to telemetry/inpatient. Continue IV hydration. Continue IV antibiotics. The patient will last her daughter to bring her last urine culture  results from [place]. Follow-up results of urine culture and sensitivity done today in the emergency room.  Active Problems:   Type 2 diabetes mellitus (HCC) Carbohydrate modified diet. Continue metformin. CBG monitoring with sensitive regular insulin sliding scale.    Hypothyroidism Continue levothyroxine 125 g by mouth daily. Monitor TSH periodically.    CAD (coronary artery disease) Continue aspirin. Continue Plavix. Continue simvastatin and lifestyle modifications.    Essential hypertension Continue amlodipine 5 mg by mouth daily. Continue extended release metoprolol 100 mg by mouth daily. Continue valsartan 160 mg by mouth daily. Monitor blood pressure periodically.    Hyperlipidemia Continue simvastatin and monitor LFTs periodically.   Code Status: Full code. DVT Prophylaxis: Lovenox SQ. Family Communication:  Disposition Plan: Admit for IV antibiotic therapy.  Time spent: About 70 minutes were used in the process of this admission.  Reubin Milan, M.D. [facility] Pager [phone].

## 2015-05-16 NOTE — ED Notes (Signed)
Patient transported to X-ray 

## 2015-05-16 NOTE — ED Provider Notes (Signed)
CSN: [id number]     Arrival date & time 05/16/15  1821 History   First MD Initiated Contact with Patient 05/16/15 1828     Chief Complaint  Patient presents with  . Fever     (Consider location/radiation/quality/duration/timing/severity/associated sxs/prior Treatment) HPI  Blood pressure 164/83, pulse 125, temperature 100.8 F (38.2 C), temperature source Oral, resp. rate 18, weight 78.472 kg, SpO2 95 %.  Ebony [name] is a 70 y.o. female with past medical history significant for non-insulin-dependent diabetes, hypertension, hyperlipidemia, CAD and complaining of dysuria, hematuria, urinary frequency onset greater than one month ago. Fever onset today with Tmax 103.2 Pt has had intermittent right flank pain x2 weeks. Pt has been on Cipro, doxy, rifampin, keflex, bactrim and was called today to be switched to Levaquin.  She denies cough, chest pain, shortness of breath that she states when she wakes up in the morning she has a lot of phlegm  which is yellowish. She denies sinus pressure, pain, rhinorrhea, headache, cervicalgia, rash, tick bite, abdominal pain, change in bowel habits, abnormal vaginal discharge, she does note that she is intermittently had vaginal itching which she attributes to [place] infection secondary to all the antibiotics. On review of systems she notes recent travel to [place]. Has not been hospitalized in the last 3 months. APAP given by EMS  Primary care: Inda Merlin   Past Medical History  Diagnosis Date  . HYPERTENSION   . HYPERLIPIDEMIA   . HYPOTHYROIDISM   . Diabetes ([place])   . CAD     PCI-S, occluded RCA treated with 3.0 x 78mm Taxus DES  . Cystocele with small rectocele and uterine descent     history of    Past Surgical History  Procedure Laterality Date  . Cesarean section      x2  . Knee arthroscopy Bilateral   . Vaginal hysterectomy    . Percutaneous coronary stent intervention (pci-s)      of the RCA with Taxus DES  . Uterine suspension      sling  procedure   Family History  Problem Relation Age of Onset  . CAD Brother   . CAD Brother   . Diabetes Mother   . Other Father     poor circulation  . Healthy Sister   . Healthy Brother    Social History  Substance Use Topics  . Smoking status: Former Smoker -- 1.00 packs/day for 15 years    Types: Cigarettes    Quit date: 12/08/1984  . Smokeless tobacco: None  . Alcohol Use: No   OB History    No data available     Review of Systems  10 systems reviewed and found to be negative, except as noted in the HPI.  Allergies  Codeine phosphate and Macrodantin  Home Medications   Prior to Admission medications   Medication Sig Start Date End Date Taking? Authorizing Provider  amLODipine (NORVASC) 5 MG tablet Take 5 mg by mouth daily.   Yes Historical Provider, MD  aspirin 81 MG tablet Take 81 mg by mouth daily.     Yes Historical Provider, MD  clopidogrel (PLAVIX) 75 MG tablet Take 1 tablet by mouth Daily. 08/24/10  Yes Historical Provider, MD  Co-Enzyme Q10 200 MG CAPS Take 1 capsule by mouth daily. 12/16/13  Yes Burnell Blanks, MD  Cranberry-Vitamin C-Probiotic (AZO CRANBERRY) 250-30 MG TABS Take 1 tablet by mouth 2 (two) times daily.   Yes Historical Provider, MD  ferrous sulfate 325 (65 FE) MG  EC tablet Take 325 mg by mouth daily with breakfast.   Yes Historical Provider, MD  fish oil-omega-3 fatty acids 1000 MG capsule Take 1 g by mouth daily.     Yes Historical Provider, MD  levothyroxine (SYNTHROID, LEVOTHROID) 125 MCG tablet Take 125 mcg by mouth daily before breakfast.   Yes Historical Provider, MD  metFORMIN (GLUCOPHAGE) 500 MG tablet Take 500 mg by mouth 2 (two) times daily.  10/26/12  Yes Historical Provider, MD  metoprolol succinate (TOPROL-XL) 100 MG 24 hr tablet Take 100 mg by mouth daily. Take with or immediately following a meal.   Yes Historical Provider, MD  pantoprazole (PROTONIX) 40 MG tablet Take 1 tablet by mouth Daily. 08/13/10  Yes Historical  Provider, MD  simvastatin (ZOCOR) 40 MG tablet Take 1 tablet by mouth Daily. 08/13/10  Yes Historical Provider, MD  sulfamethoxazole-trimethoprim (BACTRIM DS,SEPTRA DS) 800-160 MG tablet Take 1 tablet by mouth 2 (two) times daily. ABT Start Date 05/10/15 & End Date 05/17/15. 05/11/15  Yes Historical Provider, MD  valsartan (DIOVAN) 160 MG tablet Take 160 mg by mouth daily.   Yes Historical Provider, MD  VITAMIN D, CHOLECALCIFEROL, PO Take 2,000 Units by mouth daily.     Yes Historical Provider, MD  fluticasone (FLONASE) 50 MCG/ACT nasal spray Place 1 spray into both nostrils daily as needed for allergies.  09/13/10   Historical Provider, MD  NITROSTAT 0.4 MG SL tablet Place 0.4 mg under the tongue every 5 (five) minutes as needed for chest pain.  09/17/12   Historical Provider, MD   BP 148/83 mmHg  Pulse 97  Temp(Src) 99.5 F (37.5 C) (Oral)  Resp 22  Wt 78.472 kg  SpO2 97% Physical Exam  Constitutional: She is oriented to person, place, and time. She appears well-developed and well-nourished. No distress.  HENT:  Head: Normocephalic and atraumatic.  Mouth/Throat: Oropharynx is clear and moist.  No drooling or stridor. Posterior pharynx mildly erythematous no significant tonsillar hypertrophy. No exudate. Soft palate rises symmetrically. No TTP or induration under tongue.   No tenderness to palpation of frontal or bilateral maxillary sinuses.  No mucosal edema in the nares.  Bilateral tympanic membranes with normal architecture and good light reflex.    Eyes: Conjunctivae and EOM are normal. Pupils are equal, round, and reactive to light.  Neck: Normal range of motion.  FROM to C-spine. Pt can touch chin to chest without discomfort. No TTP of midline cervical spine.   Cardiovascular: Normal rate, regular rhythm and intact distal pulses.   Pulmonary/Chest: Effort normal and breath sounds normal. No stridor. No respiratory distress. She has no wheezes. She has no rales. She exhibits no  tenderness.  Abdominal: Soft. Bowel sounds are normal. She exhibits no distension and no mass. There is no tenderness. There is no rebound and no guarding.  Genitourinary:  No CVA tenderness to percussion bilaterally  Musculoskeletal: Normal range of motion.  Neurological: She is alert and oriented to person, place, and time.  Psychiatric: She has a normal mood and affect.  Nursing note and vitals reviewed.   ED Course  Procedures (including critical care time) Labs Review Labs Reviewed  URINALYSIS, ROUTINE W REFLEX MICROSCOPIC (NOT AT [facility]) - Abnormal; Notable for the following:    Hgb urine dipstick SMALL (*)    Leukocytes, UA MODERATE (*)    All other components within normal limits  BASIC METABOLIC PANEL - Abnormal; Notable for the following:    CO2 18 (*)    Glucose, Bld  197 (*)    All other components within normal limits  URINE MICROSCOPIC-ADD ON - Abnormal; Notable for the following:    Squamous Epithelial / LPF 0-5 (*)    Bacteria, UA FEW (*)    All other components within normal limits  I-STAT CG4 LACTIC ACID, ED - Abnormal; Notable for the following:    Lactic Acid, Venous 2.74 (*)    All other components within normal limits  URINE CULTURE  CULTURE, BLOOD (ROUTINE X 2)  CULTURE, BLOOD (ROUTINE X 2)  CBC  HEPATIC FUNCTION PANEL  I-STAT CG4 LACTIC ACID, ED    Imaging Review Dg Chest 2 View  05/16/2015  CLINICAL DATA:  Cough.  Fever. EXAM: CHEST  2 VIEW COMPARISON:  May 11, 2004. FINDINGS: The heart size and mediastinal contours are within normal limits. Both lungs are clear. No pneumothorax or pleural effusion is noted. The visualized skeletal structures are unremarkable. IMPRESSION: No active cardiopulmonary disease. Electronically Signed   By: Marijo Conception, M.D.   On: 05/16/2015 19:45   I have personally reviewed and evaluated these images and lab results as part of my medical decision-making.   EKG Interpretation None      MDM   Final diagnoses:   Sepsis, due to unspecified organism [facility])  UTI (lower urinary tract infection)    Filed Vitals:   05/16/15 1940 05/16/15 2000 05/16/15 2030 05/16/15 2100  BP:  132/75 134/74 148/83  Pulse:  97 91 97  Temp: 99.5 F (37.5 C)     TempSrc: Oral     Resp:  20 20 22   Weight:      SpO2:  96% 96% 97%    Medications  sodium chloride 0.9 % bolus 1,000 mL (1,000 mLs Intravenous New Bag/Given 05/16/15 1931)    Followed by  sodium chloride 0.9 % bolus 500 mL (not administered)  azithromycin (ZITHROMAX) 500 mg in dextrose 5 % 250 mL IVPB (500 mg Intravenous New Bag/Given 05/16/15 2034)  cefTRIAXone (ROCEPHIN) 1 g in dextrose 5 % 50 mL IVPB (not administered)  azithromycin (ZITHROMAX) 500 mg in dextrose 5 % 250 mL IVPB (not administered)  cefTRIAXone (ROCEPHIN) 1 g in dextrose 5 % 50 mL IVPB (0 g Intravenous Stopped 05/16/15 2034)    Ebony [name] is 70 y.o. female presenting with Recurrent urinary tract infection, has been on multiple outpatient antibiotics, she spiked a fever today, has had intermittent right flank pain over several weeks. Patient is febrile to 100.8, tachycardic to 125 bpm. She is overall nontoxic appearing. No CVA tenderness to percussion on my exam. Has not been hospitalized last 3 months, will be started on a community-acquired regimen of Rocephin and azithromycin. Patient denies cough however states that she has a lot of phlegm when she wakes up in the morning. No signs of sinus infection, we'll check a chest x-ray. Urinalysis is consistent with infection, culture pending. Chart review shows that this patient had a cystocele noted on hysterectomy. She has followed with urology in the past. This may be contributing to her recalcitrant the UTI.  Blood work reassuring with normal leukocytosis however mildly elevated lactic acid at 2.74. Urinalysis consistent with infection. Patient will be admitted to Dr. Olevia Bowens.      Monico Blitz, PA-C 05/16/15 2126  Alfonzo Beers,  MD 05/16/15 [phone]

## 2015-05-16 NOTE — Progress Notes (Signed)
[facility] consulted for medication cost assistance.  Patient with [facility] insurance.  Patient reports her tier one and tier two medications are free through mail order.  Patient reports she had trouble affording an antibiotic they had placed her on which was 176 dollars, patient did not take it because it was so expensive.  She could not remember th name.  She reports [employer] would not cover it.  Patient reports she has been on five antibiotics thus far for uti: Cipro, doxycycline with rifampin, keflex, and bactrim.  Patient reports they called her with her urine culture results and wanted her to take Levaquin, but she reports she has taken too many abx and is now here in the hospital.  Admitting MD at bedside.  No further EDCM needs at this time.

## 2015-05-16 NOTE — Progress Notes (Signed)
PHARMACIST - PHYSICIAN ORDER COMMUNICATION  CONCERNING: P&T Medication Policy on Herbal Medications  DESCRIPTION:  This patient's order for:  Azo-cranberry  has been noted.  This product(s) is classified as an "herbal" or natural product. Due to a lack of definitive safety studies or FDA approval, nonstandard manufacturing practices, plus the potential risk of unknown drug-drug interactions while on inpatient medications, the Pharmacy and Therapeutics Committee does not permit the use of "herbal" or natural products of this type within [facility].   ACTION TAKEN: The pharmacy department is unable to verify this order at this time and order has been discontinued. Please reevaluate patient's clinical condition at discharge and address if the herbal or natural product(s) should be resumed at that time.  Royetta Asal, PharmD, BCPS Pager [phone] 05/16/2015 11:07 PM

## 2015-05-16 NOTE — ED Notes (Signed)
Nurse drawing labs. 

## 2015-05-16 NOTE — ED Notes (Signed)
Pt presents from home after battling a UTI x 2 month with 5 different antibiotics. States she was called by the hospital in [place] today and told to stop taking bactrim and start taking levaquin. Pt now feels pain in her R flank. Febrile at 103.2 with PTAR and was given 1g tylenol. Alert and oriented.

## 2015-05-17 DIAGNOSIS — E118 Type 2 diabetes mellitus with unspecified complications: Secondary | ICD-10-CM

## 2015-05-17 DIAGNOSIS — N390 Urinary tract infection, site not specified: Secondary | ICD-10-CM

## 2015-05-17 DIAGNOSIS — A419 Sepsis, unspecified organism: Secondary | ICD-10-CM

## 2015-05-17 DIAGNOSIS — E039 Hypothyroidism, unspecified: Secondary | ICD-10-CM

## 2015-05-17 DIAGNOSIS — I10 Essential (primary) hypertension: Secondary | ICD-10-CM

## 2015-05-17 DIAGNOSIS — E785 Hyperlipidemia, unspecified: Secondary | ICD-10-CM

## 2015-05-17 LAB — COMPREHENSIVE METABOLIC PANEL
ALBUMIN: 3.4 g/dL — AB (ref 3.5–5.0)
ALT: 24 U/L (ref 14–54)
AST: 16 U/L (ref 15–41)
Alkaline Phosphatase: 65 U/L (ref 38–126)
Anion gap: 7 (ref 5–15)
BUN: 11 mg/dL (ref 6–20)
CHLORIDE: 114 mmol/L — AB (ref 101–111)
CO2: 23 mmol/L (ref 22–32)
Calcium: 8.8 mg/dL — ABNORMAL LOW (ref 8.9–10.3)
Creatinine, Ser: 0.84 mg/dL (ref 0.44–1.00)
GFR calc Af Amer: >60 mL/min (ref >60)
Glucose, Bld: 164 mg/dL — ABNORMAL HIGH (ref 65–99)
POTASSIUM: 3.7 mmol/L (ref 3.5–5.1)
Sodium: 144 mmol/L (ref 135–145)
Total Bilirubin: 0.6 mg/dL (ref 0.3–1.2)
Total Protein: 6.4 g/dL — ABNORMAL LOW (ref 6.5–8.1)

## 2015-05-17 LAB — CBC WITH DIFFERENTIAL/PLATELET
BASOS ABS: 0 10*3/uL (ref 0.0–0.1)
BASOS PCT: 0 %
EOS PCT: 0 %
Eosinophils Absolute: 0 10*3/uL (ref 0.0–0.7)
HCT: 35.8 % — ABNORMAL LOW (ref 36.0–46.0)
Hemoglobin: 12 g/dL (ref 12.0–15.0)
Lymphocytes Relative: 14 %
Lymphs Abs: 1.7 10*3/uL (ref 0.7–4.0)
MCH: 30.9 pg (ref 26.0–34.0)
MCHC: 33.5 g/dL (ref 30.0–36.0)
MCV: 92.3 fL (ref 78.0–100.0)
MONO ABS: 1.4 10*3/uL — AB (ref 0.1–1.0)
Monocytes Relative: 12 %
Neutro Abs: 8.9 10*3/uL — ABNORMAL HIGH (ref 1.7–7.7)
Neutrophils Relative %: 74 %
PLATELETS: 218 10*3/uL (ref 150–400)
RBC: 3.88 MIL/uL (ref 3.87–5.11)
RDW: 13.6 % (ref 11.5–15.5)
WBC: 12 10*3/uL — ABNORMAL HIGH (ref 4.0–10.5)

## 2015-05-17 LAB — GLUCOSE, CAPILLARY
GLUCOSE-CAPILLARY: 143 mg/dL — AB (ref 65–99)
GLUCOSE-CAPILLARY: 144 mg/dL — AB (ref 65–99)
GLUCOSE-CAPILLARY: 146 mg/dL — AB (ref 65–99)
GLUCOSE-CAPILLARY: 168 mg/dL — AB (ref 65–99)

## 2015-05-17 MED ORDER — SACCHAROMYCES BOULARDII 250 MG PO CAPS
250.0000 mg | ORAL_CAPSULE | Freq: Two times a day (BID) | ORAL | Status: DC
  Administered 2015-05-17 – 2015-05-18 (×2): 250 mg via ORAL
  Filled 2015-05-17 (×2): qty 1

## 2015-05-17 MED ORDER — SODIUM CHLORIDE 0.9 % IV SOLN: INTRAVENOUS | Status: AC

## 2015-05-17 MED ORDER — ATORVASTATIN CALCIUM 20 MG PO TABS
20.0000 mg | ORAL_TABLET | Freq: Every day | ORAL | Status: DC
  Filled 2015-05-17: qty 1

## 2015-05-17 MED ORDER — SIMVASTATIN 40 MG PO TABS
40.0000 mg | ORAL_TABLET | Freq: Every day | ORAL | Status: DC
  Administered 2015-05-17: 40 mg via ORAL
  Filled 2015-05-17: qty 1

## 2015-05-17 NOTE — Progress Notes (Signed)
[facility] PROGRESS NOTE  Ebony [name] [id number] DOB: [birth year] DOA: 05/16/2015 PCP: Henrine Screws, MD  Assessment/Plan:  Sepsis secondary to UTI (HCC)/UTI (urinary tract infection) Sepsis features present on admission: tachycardia, leukocytosis, RR 24, elevated lactic acid and source of infection been her urine.  Sepsis feature rapidly resolving and patient improved Continue IV hydration, but adjust rate. Continue IV antibiotics and follow culture data. Urine culture results from [place] demonstrating E. Coli UTI with microorganism resistant to Bactrim; but sensitive to rocephin and levaquin. Follow-up results of urine culture and sensitivity done today in the emergency room. Continue supportive care   Type 2 diabetes mellitus (HCC) Continue carbohydrate modified diet. Continue metformin. CBG monitoring with sensitive regular insulin sliding scale. Outpatient follow up of A1C and further adjustments to her hypoglycemic regimen as needed   Hypothyroidism Continue levothyroxine 125 g by mouth daily.   CAD (coronary artery disease) Continue aspirin. Continue Plavix. Continue simvastatin and lifestyle modifications.   Essential hypertension Continue amlodipine 5 mg by mouth daily. Continue extended release metoprolol 100 mg by mouth daily. Continue valsartan 160 mg by mouth daily. Monitor blood pressure and adjust antihypertensive regimen    Hyperlipidemia Continue simvastatin   Code Status: Full Family Communication: no family at bedside; patient oriented X3 and case discussed directly with patient Disposition Plan: remains inpatient, continue IV antibiotics; follow culture data   Consultants:  None   Procedures:  See below for x-ray reports   Antibiotics:  Rocephin   HPI/Subjective: Afebrile, no CP, no SOB and currently no complaining of dysuria. Tolerating diet and feeling much better.  Objective: Filed Vitals:   05/17/15 1348  05/17/15 1624  BP: 141/62   Pulse: 92   Temp: 98.9 F (37.2 C) 99.5 F (37.5 C)  Resp: 20     Intake/Output Summary (Last 24 hours) at 05/17/15 1753 Last data filed at 05/17/15 1500  Gross per 24 hour  Intake 2876.66 ml  Output   2650 ml  Net 226.66 ml   Filed Weights   05/16/15 1842 05/17/15 0517  Weight: 78.472 kg (173 lb) 79.107 kg (174 lb 6.4 oz)    Exam:   General:  Afebrile, no CP, no SOB and currently no complaining of dysuria.   Cardiovascular: S1 and S2, no rubs or gallops  Respiratory: CTA bilaterally  Abdomen: soft, NT, ND, positive BS  Musculoskeletal: no edema or cyanosis   Data Reviewed: Basic Metabolic Panel:  Recent Labs Lab 05/16/15 1903 05/17/15 0505  NA 138 144  K 3.8 3.7  CL 105 114*  CO2 18* 23  GLUCOSE 197* 164*  BUN 14 11  CREATININE 0.95 0.84  CALCIUM 9.5 8.8*   GFR Estimated Creatinine Clearance: 63.9 mL/min (by C-G formula based on Cr of 0.84).  Liver Function Tests:  Recent Labs Lab 05/16/15 1903 05/17/15 0505  AST 29 16  ALT 31 24  ALKPHOS 89 65  BILITOT 0.7 0.6  PROT 7.7 6.4*  ALBUMIN 4.1 3.4*   CBC:  Recent Labs Lab 05/16/15 1903 05/17/15 0505  WBC 9.9 12.0*  NEUTROABS  --  8.9*  HGB 13.9 12.0  HCT 40.8 35.8*  MCV 89.1 92.3  PLT 219 218   CBG:  Recent Labs Lab 05/17/15 0746 05/17/15 1207 05/17/15 1650  GLUCAP 143* 144* 146*    Recent Results (from the past 240 hour(s))  Urine C&S     Status: None (Preliminary result)   Collection Time: 05/16/15  6:33 PM  Result Value Ref Range Status  Specimen Description URINE, CLEAN CATCH  Final   Special Requests Normal  Final   Culture   Final    TOO YOUNG TO READ Performed at [facility]    Report Status PENDING  Incomplete  Blood Culture (routine x 2)     Status: None (Preliminary result)   Collection Time: 05/16/15  7:00 PM  Result Value Ref Range Status   Specimen Description BLOOD LEFT HAND  Final   Special Requests BOTTLES DRAWN  AEROBIC AND ANAEROBIC 5 CC EACH  Final   Culture   Final    NO GROWTH < 24 HOURS Performed at [facility]    Report Status PENDING  Incomplete  Blood Culture (routine x 2)     Status: None (Preliminary result)   Collection Time: 05/16/15  7:11 PM  Result Value Ref Range Status   Specimen Description BLOOD RIGHT ANTECUBITAL  Final   Special Requests BOTTLES DRAWN AEROBIC AND ANAEROBIC 5CC EACH  Final   Culture   Final    NO GROWTH < 24 HOURS Performed at [facility]    Report Status PENDING  Incomplete     Studies: Dg Chest 2 View  05/16/2015  CLINICAL DATA:  Cough.  Fever. EXAM: CHEST  2 VIEW COMPARISON:  May 11, 2004. FINDINGS: The heart size and mediastinal contours are within normal limits. Both lungs are clear. No pneumothorax or pleural effusion is noted. The visualized skeletal structures are unremarkable. IMPRESSION: No active cardiopulmonary disease. Electronically Signed   By: Marijo Conception, M.D.   On: 05/16/2015 19:45    Scheduled Meds: . amLODipine  5 mg Oral Daily  . aspirin EC  81 mg Oral Daily  . atorvastatin  20 mg Oral q1800  . cefTRIAXone (ROCEPHIN)  IV  1 g Intravenous Q24H  . clopidogrel  75 mg Oral Daily  . enoxaparin (LOVENOX) injection  40 mg Subcutaneous Q24H  . ferrous sulfate  325 mg Oral Q breakfast  . insulin aspart  0-9 Units Subcutaneous TID WC  . irbesartan  150 mg Oral Daily  . levothyroxine  125 mcg Oral QAC breakfast  . metFORMIN  500 mg Oral BID WC  . metoprolol succinate  100 mg Oral Daily  . omega-3 acid ethyl esters  1 g Oral Daily  . pantoprazole  40 mg Oral Daily  . saccharomyces boulardii  250 mg Oral BID  . sodium chloride flush  3 mL Intravenous Q12H   Continuous Infusions: . sodium chloride 75 mL/hr at 05/17/15 1747    Principal Problem:   Sepsis secondary to UTI [facility]) Active Problems:   Hypothyroidism   CAD (coronary artery disease)   Essential hypertension   Hyperlipidemia   Type 2 diabetes mellitus  ([place])   UTI (urinary tract infection)    Time spent: 27 minutes    Barton Dubois  [facility] Pager [phone]. If 7PM-7AM, please contact night-coverage at [email or web address], password [facility] 05/17/2015, 5:53 PM  LOS: 1 day

## 2015-05-17 NOTE — Progress Notes (Signed)
Inpatient Diabetes Program Recommendations  AACE/ADA: New Consensus Statement on Inpatient Glycemic Control (2015)  Target Ranges:  Prepandial:   less than 140 mg/dL      Peak postprandial:   less than 180 mg/dL (1-2 hours)      Critically ill patients:  140 - 180 mg/dL   Review of Glycemic Control  Diabetes history: DM2 Outpatient Diabetes medications: metformin 500 mg bid Current orders for Inpatient glycemic control: metformin 500 mg bid, Novolog sensitive tidwc  Results for Ebony [name], Ebony [name] (MRN [id number]) as of 05/17/2015 10:56  Ref. Range 05/17/2015 05:05  Sodium Latest Ref Range: 135-145 mmol/L 144  Potassium Latest Ref Range: 3.5-5.1 mmol/L 3.7  Chloride Latest Ref Range: 101-111 mmol/L 114 (H)  CO2 Latest Ref Range: 22-32 mmol/L 23  BUN Latest Ref Range: 6-20 mg/dL 11  Creatinine Latest Ref Range: 0.44-1.00 mg/dL 0.84  Calcium Latest Ref Range: 8.9-10.3 mg/dL 8.8 (L)  EGFR (Non-African Amer.) Latest Ref Range: >60 mL/min >60  EGFR (African American) Latest Ref Range: >60 mL/min >60  Glucose Latest Ref Range: 65-99 mg/dL 164 (H)  Anion gap Latest Ref Range: 5-15  7  Results for Ebony [name], Ebony [name] (MRN [id number]) as of 05/17/2015 10:56  Ref. Range 05/16/2015 19:03 05/17/2015 05:05  Glucose Latest Ref Range: 65-99 mg/dL 197 (H) 164 (H)  Blood sugars elevated.  Inpatient Diabetes Program Recommendations:    Please order HgbA1C to assess glycemic control prior to hospitalization.  Agree with present orders. Will follow.  Thank you. Lorenda Peck, RD, LDN, CDE Inpatient Diabetes Coordinator [phone]

## 2015-05-17 NOTE — Care Management Note (Signed)
Case Management Note  Patient Details  Name: Ebony [name] MRN: [id number] Date of Birth: 11-18-1945  Subjective/Objective: 70 y/o f admitted w/Sepsis-UTI. From home.Per patient-prior to hospitalization patient had a script for abx with a cost of $176-insurance did not pay for-patient couldn't afford.  Insurance med policy-Has tier 1 & 2-free with mail order. Pharmacy-[facility] [place]. MD notified that I can check benefit if known-med/dose/route/duration.                   Action/Plan:d/c plan home.   Expected Discharge Date:   (unknown)               Expected Discharge Plan:  Home/Self Care  In-House Referral:     Discharge planning Services  CM Consult  Post Acute Care Choice:    Choice offered to:     DME Arranged:    DME Agency:     HH Arranged:    HH Agency:     Status of Service:  In process, will continue to follow  Medicare Important Message Given:    Date Medicare IM Given:    Medicare IM give by:    Date Additional Medicare IM Given:    Additional Medicare Important Message give by:     If discussed at [place] of Stay Meetings, dates discussed:    Additional Comments:  Dessa Phi, RN 05/17/2015, 2:16 PM

## 2015-05-17 NOTE — Progress Notes (Signed)
The order for simvastatin(Zocor) was changed to an equivalent dose of atorvastatin(Lipitor) due to the potential drug interaction with amlodipine.  When taken in combination with medications that inhibit its metabolism, simvastatin can accumulate which increases the risk of liver toxicity, myopathy, or rhabdomyolysis.  Simvastatin dose should not exceed 10mg /day in patients taking verapamil, diltiazem, fibrates, or niacin >or= 1g/day.   Simvastatin dose should not exceed 20mg /day in patients taking amlodipine, ranolazine or amiodarone.   Please consider this potential interaction at discharge.   Royetta Asal, PharmD, BCPS Pager [phone] 05/17/2015 5:02 AM

## 2015-05-18 DIAGNOSIS — N390 Urinary tract infection, site not specified: Secondary | ICD-10-CM | POA: Insufficient documentation

## 2015-05-18 LAB — CBC WITH DIFFERENTIAL/PLATELET
Basophils Absolute: 0 10*3/uL (ref 0.0–0.1)
Basophils Relative: 0 %
EOS ABS: 0.1 10*3/uL (ref 0.0–0.7)
Eosinophils Relative: 1 %
HEMATOCRIT: 36.5 % (ref 36.0–46.0)
HEMOGLOBIN: 12.4 g/dL (ref 12.0–15.0)
LYMPHS ABS: 2.3 10*3/uL (ref 0.7–4.0)
Lymphocytes Relative: 24 %
MCH: 30.7 pg (ref 26.0–34.0)
MCHC: 34 g/dL (ref 30.0–36.0)
MCV: 90.3 fL (ref 78.0–100.0)
MONOS PCT: 13 %
Monocytes Absolute: 1.2 10*3/uL — ABNORMAL HIGH (ref 0.1–1.0)
NEUTROS ABS: 6 10*3/uL (ref 1.7–7.7)
NEUTROS PCT: 62 %
Platelets: 205 10*3/uL (ref 150–400)
RBC: 4.04 MIL/uL (ref 3.87–5.11)
RDW: 13.1 % (ref 11.5–15.5)
WBC: 9.6 10*3/uL (ref 4.0–10.5)

## 2015-05-18 LAB — BASIC METABOLIC PANEL
Anion gap: 5 (ref 5–15)
BUN: 9 mg/dL (ref 6–20)
CHLORIDE: 110 mmol/L (ref 101–111)
CO2: 24 mmol/L (ref 22–32)
CREATININE: 0.77 mg/dL (ref 0.44–1.00)
Calcium: 8.7 mg/dL — ABNORMAL LOW (ref 8.9–10.3)
GFR calc Af Amer: >60 mL/min (ref >60)
GFR calc non Af Amer: >60 mL/min (ref >60)
Glucose, Bld: 154 mg/dL — ABNORMAL HIGH (ref 65–99)
POTASSIUM: 3.8 mmol/L (ref 3.5–5.1)
SODIUM: 139 mmol/L (ref 135–145)

## 2015-05-18 LAB — GLUCOSE, CAPILLARY
GLUCOSE-CAPILLARY: 153 mg/dL — AB (ref 65–99)
GLUCOSE-CAPILLARY: 182 mg/dL — AB (ref 65–99)

## 2015-05-18 MED ORDER — SACCHAROMYCES BOULARDII 250 MG PO CAPS: 250.0000 mg | ORAL_CAPSULE | Freq: Two times a day (BID) | ORAL | Status: AC

## 2015-05-18 MED ORDER — FLUCONAZOLE 100 MG PO TABS: 100.0000 mg | ORAL_TABLET | Freq: Every day | ORAL | Status: DC

## 2015-05-18 MED ORDER — LEVOFLOXACIN 750 MG PO TABS: 750.0000 mg | ORAL_TABLET | Freq: Every day | ORAL | Status: DC

## 2015-05-18 NOTE — Discharge Summary (Signed)
Physician Discharge Summary  Ebony [name] [id number] DOB: 1945/03/20 DOA: 05/16/2015  PCP: Henrine Screws, MD  Admit date: 05/16/2015 Discharge date: 05/18/2015  Time spent: 35 minutes  Recommendations for Outpatient Follow-up:  1. Repeat CBC to follow WBC's trend 2. Repeat BMET to follow electrolytes and renal function  3. Close follow up to final sensitivity/final urine culture results, in case adjustment to antibiotic therapy is needed   Discharge Diagnoses:    Sepsis secondary to E. Coli UTI ([place])   Hypothyroidism   CAD (coronary artery disease)   Essential hypertension   Hyperlipidemia   Type 2 diabetes mellitus ([place])   E. Coli UTI (urinary tract infection)   Discharge Condition: stable and improved. Discharge home with instructions to follow up with PCP in 10 days.   Diet recommendation: heart healthy and modified carbohydrates   Filed Weights   05/16/15 1842 05/17/15 0517 05/18/15 0550  Weight: 78.472 kg (173 lb) 79.107 kg (174 lb 6.4 oz) 80.922 kg (178 lb 6.4 oz)    History of present illness:  As per H&P written by Dr. Olevia Bowens on 05/16/15 70 y.o. female with a past medical history of CAD, status post stenting of the RCA, hypertension, hyperlipidemia, hypothyroidism, type 2 diabetes is coming to the emergency department with complaints of fever since earlier today.  Per patient, since February, she has been dealing with UTI symptoms and has been treated with at least 5 different antibiotics (ciprofloxacin and doxycycline, rifampin, cephalexin and most recently Bactrim). She was seen in [place] last week, and just returned to the area today. She was called today from [place] with cultures results and requests to switch her antibiotic to Levaquin. However, the patient states that once she got home, she felt very chilled and her temperature was 103F so she decided to come to the ER. She states that for the past 2 months, she has been having dysuria, frequency and  suprapubic tenderness as well. She states that she had right flank pain recently and earlier today. She denies vaginal discharge.  When seen in the emergency department, she stated that she was feeling better. She denies headache, cough, sore throat, chest pain, dyspnea, diarrhea. Workup reveals abnormal UA consistent with UTI.  Hospital Course:  Sepsis secondary to E. Coli UTI (HCC)/UTI (urinary tract infection) Sepsis features present on admission: tachycardia, leukocytosis, RR 24, elevated lactic acid, fever (up to 103 at home) and source of infection been her urine.  Sepsis feature rapidly resolved and patient improved/stabilize for discharge Advise to keep herself well hydrated Base on sensitivity will discharge on levaquin X 5 more days Urine culture results from [place] demonstrating E. Coli UTI with microorganism resistant to Bactrim; but sensitive to rocephin and levaquin. Will Follow-up final results of urine culture and sensitivity done during this admission (in case any changes needed) Will also discharge on diflucan X 3 doses (patient with hx of yeast after antibiotics usage)   Type 2 diabetes mellitus (HCC) Continue carbohydrate modified diet. Continue metformin. Outpatient follow up of A1C and further adjustments to her hypoglycemic regimen as needed   Hypothyroidism Continue levothyroxine 125 g by mouth daily.   CAD (coronary artery disease) Continue aspirin. Continue Plavix. Continue simvastatin and lifestyle modifications. No CP or SOB   Essential hypertension Continue amlodipine 5 mg by mouth daily. Continue extended release metoprolol 100 mg by mouth daily. Continue valsartan 160 mg by mouth daily. Advise to follow Heart Healthy diet   Hyperlipidemia Continue simvastatin   Procedures:  See below  for x-ray reports   Consultations:  None   Discharge Exam: Filed Vitals:   05/17/15 2128 05/18/15 0550  BP: 142/66 150/73  Pulse: 95 91  Temp:  99.7 F (37.6 C) 98.9 F (37.2 C)  Resp: 20 18    General: Afebrile, no CP, no SOB and currently no complaining of dysuria.   Cardiovascular: S1 and S2, no rubs or gallops  Respiratory: CTA bilaterally  Abdomen: soft, NT, ND, positive BS  Musculoskeletal: no edema or cyanosis   Discharge Instructions   Discharge Instructions    Diet - low sodium heart healthy    Complete by:  As directed      Discharge instructions    Complete by:  As directed   Keep yourself well hydrated Take medications as prescribed Please arrange follow up with PCP in 10 days Use fluconazole (1 tablet on day 3 and then 2 tablets daily after antibiotic therapy is completed) Follow heart healthy and modified carbohydrates diet          Current Discharge Medication List    START taking these medications   Details  fluconazole (DIFLUCAN) 100 MG tablet Take 1 tablet (100 mg total) by mouth daily. Qty: 3 tablet, Refills: 0    levofloxacin (LEVAQUIN) 750 MG tablet Take 1 tablet (750 mg total) by mouth daily. Qty: 5 tablet, Refills: 0    saccharomyces boulardii (FLORASTOR) 250 MG capsule Take 1 capsule (250 mg total) by mouth 2 (two) times daily. Qty: 40 capsule, Refills: 0      CONTINUE these medications which have NOT CHANGED   Details  amLODipine (NORVASC) 5 MG tablet Take 5 mg by mouth daily.    aspirin 81 MG tablet Take 81 mg by mouth daily.      clopidogrel (PLAVIX) 75 MG tablet Take 1 tablet by mouth Daily.    Co-Enzyme Q10 200 MG CAPS Take 1 capsule by mouth daily. Qty: 30 capsule, Refills: 11   Associated Diagnoses: Hyperlipidemia    Cranberry-Vitamin C-Probiotic (AZO CRANBERRY) 250-30 MG TABS Take 1 tablet by mouth 2 (two) times daily.    ferrous sulfate 325 (65 FE) MG EC tablet Take 325 mg by mouth daily with breakfast.    fish oil-omega-3 fatty acids 1000 MG capsule Take 1 g by mouth daily.      levothyroxine (SYNTHROID, LEVOTHROID) 125 MCG tablet Take 125 mcg by mouth daily  before breakfast.    metFORMIN (GLUCOPHAGE) 500 MG tablet Take 500 mg by mouth 2 (two) times daily.     metoprolol succinate (TOPROL-XL) 100 MG 24 hr tablet Take 100 mg by mouth daily. Take with or immediately following a meal.    pantoprazole (PROTONIX) 40 MG tablet Take 1 tablet by mouth Daily.    simvastatin (ZOCOR) 40 MG tablet Take 1 tablet by mouth Daily.    valsartan (DIOVAN) 160 MG tablet Take 160 mg by mouth daily.    VITAMIN D, CHOLECALCIFEROL, PO Take 2,000 Units by mouth daily.      fluticasone (FLONASE) 50 MCG/ACT nasal spray Place 1 spray into both nostrils daily as needed for allergies.     NITROSTAT 0.4 MG SL tablet Place 0.4 mg under the tongue every 5 (five) minutes as needed for chest pain.       STOP taking these medications     sulfamethoxazole-trimethoprim (BACTRIM DS,SEPTRA DS) 800-160 MG tablet        Allergies  Allergen Reactions  . Codeine Phosphate Nausea And Vomiting  . Macrodantin [Nitrofurantoin] Nausea  And Vomiting   Follow-up Information    Follow up with GATES,ROBERT NEVILL, MD. Schedule an appointment as soon as possible for a visit in 10 days.   Specialty:  Internal Medicine   Contact information:   [address] [place] [address] [phone]       The results of significant diagnostics from this hospitalization (including imaging, microbiology, ancillary and laboratory) are listed below for reference.    Significant Diagnostic Studies: Dg Chest 2 View  05/16/2015  CLINICAL DATA:  Cough.  Fever. EXAM: CHEST  2 VIEW COMPARISON:  May 11, 2004. FINDINGS: The heart size and mediastinal contours are within normal limits. Both lungs are clear. No pneumothorax or pleural effusion is noted. The visualized skeletal structures are unremarkable. IMPRESSION: No active cardiopulmonary disease. Electronically Signed   By: Marijo Conception, M.D.   On: 05/16/2015 19:45   Mm Screening Breast Tomo Bilateral  04/19/2015  CLINICAL  DATA:  Screening. EXAM: 2D DIGITAL SCREENING BILATERAL MAMMOGRAM WITH CAD AND ADJUNCT TOMO COMPARISON:  Previous exam(s). ACR Breast Density Category b: There are scattered areas of fibroglandular density. FINDINGS: There are no findings suspicious for malignancy. Images were processed with CAD. IMPRESSION: No mammographic evidence of malignancy. A result letter of this screening mammogram will be mailed directly to the patient. RECOMMENDATION: Screening mammogram in one year. (Code:SM-B-01Y) BI-RADS CATEGORY  1: Negative. Electronically Signed   By: Fidela Salisbury M.D.   On: 04/19/2015 16:15    Microbiology: Recent Results (from the past 240 hour(s))  Urine C&S     Status: Abnormal (Preliminary result)   Collection Time: 05/16/15  6:33 PM  Result Value Ref Range Status   Specimen Description URINE, CLEAN CATCH  Final   Special Requests Normal  Final   Culture >=100,000 COLONIES/mL ESCHERICHIA COLI (A)  Final   Report Status PENDING  Incomplete  Blood Culture (routine x 2)     Status: None (Preliminary result)   Collection Time: 05/16/15  7:00 PM  Result Value Ref Range Status   Specimen Description BLOOD LEFT HAND  Final   Special Requests BOTTLES DRAWN AEROBIC AND ANAEROBIC 5 CC EACH  Final   Culture   Final    NO GROWTH < 24 HOURS Performed at [facility]    Report Status PENDING  Incomplete  Blood Culture (routine x 2)     Status: None (Preliminary result)   Collection Time: 05/16/15  7:11 PM  Result Value Ref Range Status   Specimen Description BLOOD RIGHT ANTECUBITAL  Final   Special Requests BOTTLES DRAWN AEROBIC AND ANAEROBIC 5CC EACH  Final   Culture   Final    NO GROWTH < 24 HOURS Performed at [facility]    Report Status PENDING  Incomplete     Labs: Basic Metabolic Panel:  Recent Labs Lab 05/16/15 1903 05/17/15 0505 05/18/15 0501  NA 138 144 139  K 3.8 3.7 3.8  CL 105 114* 110  CO2 18* 23 24  GLUCOSE 197* 164* 154*  BUN 14 11 9    CREATININE 0.95 0.84 0.77  CALCIUM 9.5 8.8* 8.7*   Liver Function Tests:  Recent Labs Lab 05/16/15 1903 05/17/15 0505  AST 29 16  ALT 31 24  ALKPHOS 89 65  BILITOT 0.7 0.6  PROT 7.7 6.4*  ALBUMIN 4.1 3.4*   CBC:  Recent Labs Lab 05/16/15 1903 05/17/15 0505 05/18/15 0501  WBC 9.9 12.0* 9.6  NEUTROABS  --  8.9* 6.0  HGB  13.9 12.0 12.4  HCT 40.8 35.8* 36.5  MCV 89.1 92.3 90.3  PLT 219 218 205   CBG:  Recent Labs Lab 05/17/15 1207 05/17/15 1650 05/17/15 2126 05/18/15 0725 05/18/15 1127  GLUCAP 144* 146* 168* 153* 182*    Signed:  Barton Dubois MD.  [facility] 05/18/2015, 1:29 PM

## 2015-05-18 NOTE — Care Management Note (Signed)
Case Management Note  Patient Details  Name: Ebony [name] MRN: [id number] Date of Birth: 28-Sep-1945  Subjective/Objective: Will check benefit for Levaquin 750mg  po 1 tab qd x 5days.Patient has insurance-will let her know her cost once info available.                  Action/Plan:d/c plan home.   Expected Discharge Date:   (unknown)               Expected Discharge Plan:  Home/Self Care  In-House Referral:     Discharge planning Services  CM Consult  Post Acute Care Choice:    Choice offered to:     DME Arranged:    DME Agency:     HH Arranged:    [place] Agency:     Status of Service:  Completed, signed off  Medicare Important Message Given:    Date Medicare IM Given:    Medicare IM give by:    Date Additional Medicare IM Given:    Additional Medicare Important Message give by:     If discussed at [place] of Stay Meetings, dates discussed:    Additional Comments:  Dessa Phi, RN 05/18/2015, 10:41 AM

## 2015-05-18 NOTE — Progress Notes (Signed)
Patient given discharge instructions and prescriptions via teach back method.  Patient verbalized understanding.

## 2015-05-18 NOTE — Care Management Note (Signed)
Case Management Note  Patient Details  Name: SHAQUALA CUNICO MRN: [id number] Date of Birth: [birth year]  Subjective/Objective: Benefit checked-                 S/W  [facility] @ [employer] RX # [phone]   LEVAQUIN 750 MG PO 1 TAB QD X 5 DAYS  NOT ON FORMULARY   LEVOFLOXACIN 750 MG PO 1 TAB QD X 5   COVER- YES  CO-PAY- $20.00  TIER- 2 DRUG  PRIOR APPROVAL - NO  PHARMACY : [facility]        Previous Messages     ----- Message -----   From: Dessa Phi, RN   Sent: 05/18/2015 10:38 AM    To: [facility] Ccm Case Mgr Assistant  Subject: Re:[facility] benefit check                Levaquin/levofloxacin 750mg  po 1 tab qd x 5days.Dx:UTI.[place].Thanks               Action/Plan:d/c home.   Expected Discharge Date:   (unknown)               Expected Discharge Plan:  Home/Self Care  In-House Referral:     Discharge planning Services  CM Consult  Post Acute Care Choice:    Choice offered to:     DME Arranged:    DME Agency:     HH Arranged:    [place] Agency:     Status of Service:  Completed, signed off  Medicare Important Message Given:    Date Medicare IM Given:    Medicare IM give by:    Date Additional Medicare IM Given:    Additional Medicare Important Message give by:     If discussed at [place] of Stay Meetings, dates discussed:    Additional Comments:  Dessa Phi, RN 05/18/2015, 12:34 PM

## 2015-05-19 LAB — URINE CULTURE
Culture: >=100000 — AB
Special Requests: NORMAL

## 2015-05-21 LAB — CULTURE, BLOOD (ROUTINE X 2)
CULTURE: NO GROWTH
Culture: NO GROWTH

## 2015-05-29 DIAGNOSIS — Z0000 Encounter for general adult medical examination without abnormal findings: Secondary | ICD-10-CM | POA: Diagnosis not present

## 2015-05-29 DIAGNOSIS — M5136 Other intervertebral disc degeneration, lumbar region: Secondary | ICD-10-CM | POA: Diagnosis not present

## 2015-05-29 DIAGNOSIS — N390 Urinary tract infection, site not specified: Secondary | ICD-10-CM | POA: Diagnosis not present

## 2015-05-29 DIAGNOSIS — M9903 Segmental and somatic dysfunction of lumbar region: Secondary | ICD-10-CM | POA: Diagnosis not present

## 2015-05-29 DIAGNOSIS — E0840 Diabetes mellitus due to underlying condition with diabetic neuropathy, unspecified: Secondary | ICD-10-CM | POA: Diagnosis not present

## 2015-05-29 DIAGNOSIS — B9620 Unspecified Escherichia coli [E. coli] as the cause of diseases classified elsewhere: Secondary | ICD-10-CM | POA: Diagnosis not present

## 2015-05-30 DIAGNOSIS — E0840 Diabetes mellitus due to underlying condition with diabetic neuropathy, unspecified: Secondary | ICD-10-CM | POA: Diagnosis not present

## 2015-05-30 DIAGNOSIS — R300 Dysuria: Secondary | ICD-10-CM | POA: Diagnosis not present

## 2015-05-30 DIAGNOSIS — M62830 Muscle spasm of back: Secondary | ICD-10-CM | POA: Diagnosis not present

## 2015-05-30 DIAGNOSIS — I10 Essential (primary) hypertension: Secondary | ICD-10-CM | POA: Diagnosis not present

## 2015-05-30 DIAGNOSIS — M5136 Other intervertebral disc degeneration, lumbar region: Secondary | ICD-10-CM | POA: Diagnosis not present

## 2015-05-30 DIAGNOSIS — M9903 Segmental and somatic dysfunction of lumbar region: Secondary | ICD-10-CM | POA: Diagnosis not present

## 2015-05-30 DIAGNOSIS — M545 Low back pain: Secondary | ICD-10-CM | POA: Diagnosis not present

## 2015-05-30 DIAGNOSIS — J329 Chronic sinusitis, unspecified: Secondary | ICD-10-CM | POA: Diagnosis not present

## 2015-06-01 DIAGNOSIS — M5136 Other intervertebral disc degeneration, lumbar region: Secondary | ICD-10-CM | POA: Diagnosis not present

## 2015-06-01 DIAGNOSIS — E0840 Diabetes mellitus due to underlying condition with diabetic neuropathy, unspecified: Secondary | ICD-10-CM | POA: Diagnosis not present

## 2015-06-01 DIAGNOSIS — M9903 Segmental and somatic dysfunction of lumbar region: Secondary | ICD-10-CM | POA: Diagnosis not present

## 2015-06-05 DIAGNOSIS — M5136 Other intervertebral disc degeneration, lumbar region: Secondary | ICD-10-CM | POA: Diagnosis not present

## 2015-06-05 DIAGNOSIS — E0840 Diabetes mellitus due to underlying condition with diabetic neuropathy, unspecified: Secondary | ICD-10-CM | POA: Diagnosis not present

## 2015-06-05 DIAGNOSIS — M9903 Segmental and somatic dysfunction of lumbar region: Secondary | ICD-10-CM | POA: Diagnosis not present

## 2015-06-08 DIAGNOSIS — E0840 Diabetes mellitus due to underlying condition with diabetic neuropathy, unspecified: Secondary | ICD-10-CM | POA: Diagnosis not present

## 2015-06-08 DIAGNOSIS — M5136 Other intervertebral disc degeneration, lumbar region: Secondary | ICD-10-CM | POA: Diagnosis not present

## 2015-06-08 DIAGNOSIS — N390 Urinary tract infection, site not specified: Secondary | ICD-10-CM | POA: Diagnosis not present

## 2015-06-08 DIAGNOSIS — B9620 Unspecified Escherichia coli [E. coli] as the cause of diseases classified elsewhere: Secondary | ICD-10-CM | POA: Diagnosis not present

## 2015-06-08 DIAGNOSIS — M9903 Segmental and somatic dysfunction of lumbar region: Secondary | ICD-10-CM | POA: Diagnosis not present

## 2015-06-08 DIAGNOSIS — Z0000 Encounter for general adult medical examination without abnormal findings: Secondary | ICD-10-CM | POA: Diagnosis not present

## 2015-06-12 DIAGNOSIS — Z01419 Encounter for gynecological examination (general) (routine) without abnormal findings: Secondary | ICD-10-CM | POA: Diagnosis not present

## 2015-06-12 DIAGNOSIS — L900 Lichen sclerosus et atrophicus: Secondary | ICD-10-CM | POA: Diagnosis not present

## 2015-06-13 DIAGNOSIS — M5136 Other intervertebral disc degeneration, lumbar region: Secondary | ICD-10-CM | POA: Diagnosis not present

## 2015-06-13 DIAGNOSIS — E0840 Diabetes mellitus due to underlying condition with diabetic neuropathy, unspecified: Secondary | ICD-10-CM | POA: Diagnosis not present

## 2015-06-13 DIAGNOSIS — M9903 Segmental and somatic dysfunction of lumbar region: Secondary | ICD-10-CM | POA: Diagnosis not present

## 2015-06-15 DIAGNOSIS — M5136 Other intervertebral disc degeneration, lumbar region: Secondary | ICD-10-CM | POA: Diagnosis not present

## 2015-06-15 DIAGNOSIS — M9903 Segmental and somatic dysfunction of lumbar region: Secondary | ICD-10-CM | POA: Diagnosis not present

## 2015-06-15 DIAGNOSIS — E0840 Diabetes mellitus due to underlying condition with diabetic neuropathy, unspecified: Secondary | ICD-10-CM | POA: Diagnosis not present

## 2015-06-22 DIAGNOSIS — M9903 Segmental and somatic dysfunction of lumbar region: Secondary | ICD-10-CM | POA: Diagnosis not present

## 2015-06-22 DIAGNOSIS — M5136 Other intervertebral disc degeneration, lumbar region: Secondary | ICD-10-CM | POA: Diagnosis not present

## 2015-06-22 DIAGNOSIS — E0840 Diabetes mellitus due to underlying condition with diabetic neuropathy, unspecified: Secondary | ICD-10-CM | POA: Diagnosis not present

## 2015-06-28 DIAGNOSIS — M9903 Segmental and somatic dysfunction of lumbar region: Secondary | ICD-10-CM | POA: Diagnosis not present

## 2015-06-28 DIAGNOSIS — M5136 Other intervertebral disc degeneration, lumbar region: Secondary | ICD-10-CM | POA: Diagnosis not present

## 2015-06-28 DIAGNOSIS — E0840 Diabetes mellitus due to underlying condition with diabetic neuropathy, unspecified: Secondary | ICD-10-CM | POA: Diagnosis not present

## 2015-07-05 DIAGNOSIS — M9903 Segmental and somatic dysfunction of lumbar region: Secondary | ICD-10-CM | POA: Diagnosis not present

## 2015-07-05 DIAGNOSIS — E0840 Diabetes mellitus due to underlying condition with diabetic neuropathy, unspecified: Secondary | ICD-10-CM | POA: Diagnosis not present

## 2015-07-05 DIAGNOSIS — M5136 Other intervertebral disc degeneration, lumbar region: Secondary | ICD-10-CM | POA: Diagnosis not present

## 2015-07-14 DIAGNOSIS — M9903 Segmental and somatic dysfunction of lumbar region: Secondary | ICD-10-CM | POA: Diagnosis not present

## 2015-07-14 DIAGNOSIS — E0840 Diabetes mellitus due to underlying condition with diabetic neuropathy, unspecified: Secondary | ICD-10-CM | POA: Diagnosis not present

## 2015-07-14 DIAGNOSIS — M5136 Other intervertebral disc degeneration, lumbar region: Secondary | ICD-10-CM | POA: Diagnosis not present

## 2015-07-19 DIAGNOSIS — E0840 Diabetes mellitus due to underlying condition with diabetic neuropathy, unspecified: Secondary | ICD-10-CM | POA: Diagnosis not present

## 2015-07-19 DIAGNOSIS — M5136 Other intervertebral disc degeneration, lumbar region: Secondary | ICD-10-CM | POA: Diagnosis not present

## 2015-07-19 DIAGNOSIS — M9903 Segmental and somatic dysfunction of lumbar region: Secondary | ICD-10-CM | POA: Diagnosis not present

## 2015-07-25 DIAGNOSIS — E119 Type 2 diabetes mellitus without complications: Secondary | ICD-10-CM | POA: Diagnosis not present

## 2015-07-28 DIAGNOSIS — E039 Hypothyroidism, unspecified: Secondary | ICD-10-CM | POA: Diagnosis not present

## 2015-07-28 DIAGNOSIS — Z79899 Other long term (current) drug therapy: Secondary | ICD-10-CM | POA: Diagnosis not present

## 2015-07-28 DIAGNOSIS — Z0001 Encounter for general adult medical examination with abnormal findings: Secondary | ICD-10-CM | POA: Diagnosis not present

## 2015-07-28 DIAGNOSIS — I10 Essential (primary) hypertension: Secondary | ICD-10-CM | POA: Diagnosis not present

## 2015-07-28 DIAGNOSIS — E782 Mixed hyperlipidemia: Secondary | ICD-10-CM | POA: Diagnosis not present

## 2015-07-28 DIAGNOSIS — E1165 Type 2 diabetes mellitus with hyperglycemia: Secondary | ICD-10-CM | POA: Diagnosis not present

## 2015-07-28 DIAGNOSIS — Z1389 Encounter for screening for other disorder: Secondary | ICD-10-CM | POA: Diagnosis not present

## 2015-07-28 DIAGNOSIS — Z86010 Personal history of colonic polyps: Secondary | ICD-10-CM | POA: Diagnosis not present

## 2015-07-28 DIAGNOSIS — J45991 Cough variant asthma: Secondary | ICD-10-CM | POA: Diagnosis not present

## 2015-07-28 DIAGNOSIS — Z7984 Long term (current) use of oral hypoglycemic drugs: Secondary | ICD-10-CM | POA: Diagnosis not present

## 2016-01-10 ENCOUNTER — Telehealth: Payer: Self-pay | Admitting: Cardiovascular Disease

## 2016-01-10 NOTE — Telephone Encounter (Signed)
I spoke with pt. She is on wait list for December appt.  I scheduled her to see Dr. Angelena Form on 01/17/16 at 2:30.

## 2016-01-10 NOTE — Telephone Encounter (Signed)
NEw message  Pt call requesting  to speak with Rn. Pt states she received a call from the office. Please call back if needed

## 2016-01-16 DIAGNOSIS — L821 Other seborrheic keratosis: Secondary | ICD-10-CM | POA: Diagnosis not present

## 2016-01-16 NOTE — Progress Notes (Signed)
Chief Complaint  Patient presents with  . Coronary Artery Disease      History of Present Illness: 70 yo female with history of CAD, HTN, HLD, hypothyroidism, DM who is here today for cardiac follow up. She has been followed in the past by Dr. Lia Foyer. 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 She was found to have an occluded RCA, treated with a 3.0 x 16 mm Taxus DES. She also had moderate LAD stenosis and mild plaque. Last echo 01/19/13 with [id number], grade 1 diastolic dysfunction. When I saw her in November 2015 she c/o leg cramps. CoQ10 was added and her muscle aches resolved. She c/o dyspnea in February 2017. Nuclear stress test February 2017 with inferior scar but no ischemia.   She is here today for followup. No chest pain, dyspnea, palpitations. She does not exercise. She has been working for the [employer] this holiday season.   Primary Care Physician: Henrine Screws, MD   Past Medical History:  Diagnosis Date  . CAD    PCI-S, occluded RCA treated with 3.0 x 74mm Taxus DES  . Cystocele with small rectocele and uterine descent    history of   . Diabetes ([place])   . HYPERLIPIDEMIA   . HYPERTENSION   . HYPOTHYROIDISM     Past Surgical History:  Procedure Laterality Date  . CESAREAN SECTION     x2  . KNEE ARTHROSCOPY Bilateral   . PERCUTANEOUS CORONARY STENT INTERVENTION (PCI-S)     of the RCA with Taxus DES  . UTERINE SUSPENSION     sling procedure  . VAGINAL HYSTERECTOMY      Current Outpatient Prescriptions  Medication Sig Dispense Refill  . amLODipine (NORVASC) 5 MG tablet Take 5 mg by mouth daily.    Marland Kitchen aspirin 81 MG tablet Take 81 mg by mouth daily.      . clopidogrel (PLAVIX) 75 MG tablet Take 1 tablet by mouth Daily.    Marland Kitchen Co-Enzyme Q10 200 MG CAPS Take 1 capsule by mouth daily. 30 capsule 11  . fish oil-omega-3 fatty acids 1000 MG capsule Take 1 g by mouth daily.      . fluticasone (FLONASE) 50 MCG/ACT nasal spray Place 1 spray into both nostrils  daily as needed for allergies.     Marland Kitchen levothyroxine (SYNTHROID, LEVOTHROID) 125 MCG tablet Take 125 mcg by mouth daily before breakfast.    . metFORMIN (GLUCOPHAGE) 500 MG tablet Take 500 mg by mouth 2 (two) times daily.     . metoprolol succinate (TOPROL-XL) 100 MG 24 hr tablet Take 100 mg by mouth daily. Take with or immediately following a meal.    . NITROSTAT 0.4 MG SL tablet Place 0.4 mg under the tongue every 5 (five) minutes as needed for chest pain.     . pantoprazole (PROTONIX) 40 MG tablet Take 1 tablet by mouth Daily.    Marland Kitchen saccharomyces boulardii (FLORASTOR) 250 MG capsule Take 1 capsule (250 mg total) by mouth 2 (two) times daily. 40 capsule 0  . simvastatin (ZOCOR) 40 MG tablet Take 1 tablet by mouth Daily.    . valsartan (DIOVAN) 160 MG tablet Take 160 mg by mouth daily.    Marland Kitchen VITAMIN D, CHOLECALCIFEROL, PO Take 2,000 Units by mouth daily.       No current facility-administered medications for this visit.     Allergies  Allergen Reactions  . Codeine Phosphate Nausea And Vomiting  . Macrodantin [Nitrofurantoin] Nausea And Vomiting  Social History   Social History  . Marital status: Married    Spouse name: N/A  . Number of children: 2  . Years of education: N/A   Occupational History  . Retired    Social History Main Topics  . Smoking status: Former Smoker    Packs/day: 1.00    Years: 15.00    Types: Cigarettes    Quit date: 12/08/1984  . Smokeless tobacco: Not on file  . Alcohol use No  . Drug use: No  . Sexual activity: Not on file   Other Topics Concern  . Not on file   Social History Narrative  . No narrative on file    Family History  Problem Relation Age of Onset  . CAD Brother   . CAD Brother   . Diabetes Mother   . Other Father     poor circulation  . Healthy Sister   . Healthy Brother     Review of Systems:  As stated in the HPI and otherwise negative.   BP 134/64   Pulse 87   Ht 5\' 3"  (1.6 m)   Wt 177 lb (80.3 kg)   BMI 31.35  kg/m   Physical Examination: General: Well developed, well nourished, NAD  HEENT: OP clear, mucus membranes moist  SKIN: warm, dry. No rashes. Neuro: No focal deficits  Musculoskeletal: Muscle strength 5/5 all ext  Psychiatric: Mood and affect normal  Neck: No JVD, no carotid bruits, no thyromegaly, no lymphadenopathy.  Lungs:Clear bilaterally, no wheezes, rhonci, crackles Cardiovascular: Regular rate and rhythm. No murmurs, gallops or rubs. Abdomen:Soft. Bowel sounds present. Non-tender.  Extremities: No lower extremity edema. Pulses are 2 + in the bilateral DP/PT.  Echo 01/19/13: - Left ventricle: The cavity size was normal. Wall thickness was normal. Systolic function was normal. The estimated ejection fraction was in the range of 50% to 55%. Wall motion was normal; there were no regional wall motion abnormalities. Doppler parameters are consistent with abnormal left ventricular relaxation (grade 1 diastolic dysfunction). - Mitral valve: Calcified annulus.  EKG:  EKG is ordered today. The ekg ordered today demonstrates NSR, rate 87 bpm. Inferior Q-waves. unchanged  Recent Labs: 05/17/2015: ALT 24 05/18/2015: BUN 9; Creatinine, Ser 0.77; Hemoglobin 12.4; Platelets 205; Potassium 3.8; Sodium 139   Lipid Panel No results found for: CHOL, TRIG, HDL, CHOLHDL, VLDL, LDLCALC, LDLDIRECT   Wt Readings from Last 3 Encounters:  01/17/16 177 lb (80.3 kg)  05/18/15 178 lb 6.4 oz (80.9 kg)  01/12/15 173 lb (78.5 kg)     Other studies Reviewed: Additional studies/ records that were reviewed today include: . Review of the above records demonstrates:    Assessment and Plan:   1. CAD without angina: No chest pain suggestive of angina. She is on good medical therapy including ASA/Plavix,statin, beta blocker. Nuclear stress test February 2017 with no ischemia. Echo 01/19/13 with normal LV function. She will remain on dual anti-platelet therapy with ASA and Plavix since she  has a first generation Taxus DES.   2. HTN: BP controlled. No changes. Will continue beta blocker and ARB/HCTZ.   3. Hyperlipidemia: Continue statin. Lipids followed in primary care. Muscle aches better on CoQ10.   4. Diabetes mellitus: Followed in primary care.   Current medicines are reviewed at length with the patient today.  The patient does not have concerns regarding medicines.  The following changes have been made:  no change  Labs/ tests ordered today include:   Orders Placed This Encounter  Procedures  . EKG 12-Lead    Disposition:   FU with me in 12  months  Signed, Lauree Chandler, MD 01/17/2016 3:22 PM    [facility], [address], [place]  [address] Phone: [phone]; Fax: [phone]

## 2016-01-17 ENCOUNTER — Encounter: Payer: Self-pay | Admitting: Cardiovascular Disease

## 2016-01-17 ENCOUNTER — Ambulatory Visit (INDEPENDENT_AMBULATORY_CARE_PROVIDER_SITE_OTHER): Payer: Commercial Managed Care - HMO | Admitting: Cardiovascular Disease

## 2016-01-17 VITALS — BP 134/64 | HR 87 | Ht 63.0 in | Wt 177.0 lb

## 2016-01-17 DIAGNOSIS — I2510 Atherosclerotic heart disease of native coronary artery without angina pectoris: Secondary | ICD-10-CM | POA: Diagnosis not present

## 2016-01-17 DIAGNOSIS — J329 Chronic sinusitis, unspecified: Secondary | ICD-10-CM | POA: Diagnosis not present

## 2016-01-17 DIAGNOSIS — E7800 Pure hypercholesterolemia, unspecified: Secondary | ICD-10-CM | POA: Diagnosis not present

## 2016-01-17 DIAGNOSIS — I10 Essential (primary) hypertension: Secondary | ICD-10-CM | POA: Diagnosis not present

## 2016-01-17 DIAGNOSIS — R0789 Other chest pain: Secondary | ICD-10-CM | POA: Diagnosis not present

## 2016-01-17 NOTE — Patient Instructions (Signed)
Medication Instructions:  Your physician recommends that you continue on your current medications as directed. Please refer to the Current Medication list given to you today.   Labwork: none  Testing/Procedures: none  Follow-Up: Your physician recommends that you schedule a follow-up appointment as needed.    Any Other Special Instructions Will Be Listed Below (If Applicable).     If you need a refill on your cardiac medications before your next appointment, please call your pharmacy.   

## 2016-02-02 DIAGNOSIS — D127 Benign neoplasm of rectosigmoid junction: Secondary | ICD-10-CM | POA: Diagnosis not present

## 2016-02-02 DIAGNOSIS — Z86010 Personal history of colonic polyps: Secondary | ICD-10-CM | POA: Diagnosis not present

## 2016-02-06 DIAGNOSIS — D127 Benign neoplasm of rectosigmoid junction: Secondary | ICD-10-CM | POA: Diagnosis not present

## 2017-08-31 IMAGING — NM NM MISC PROCEDURE
3 series · 18 of 18 positions shown · non-contrast
Comparison: none

[Series 1: wbr_s-proj_st stress_(id)_sa · 6.5mm · 6.51mm/px · 6 of 512 frames shown]
[frame 43/512]
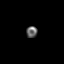
[frame 128/512]
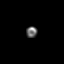
[frame 214/512]
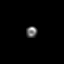
[frame 299/512]
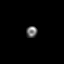
[frame 384/512]
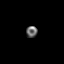
[frame 470/512]
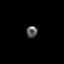

[Series 1: wbr_s-proj_st stress summed_(id)_sa · 6.5mm · 6.51mm/px · 6 of 64 frames shown]
[frame 6/64]
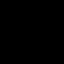
[frame 16/64]
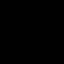
[frame 27/64]
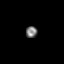
[frame 38/64]
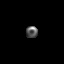
[frame 48/64]
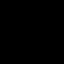
[frame 59/64]
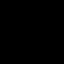

[Series 1: wbr_r-proj_st rest_(id)_sa · 6.5mm · 6.51mm/px · 6 of 64 frames shown]
[frame 6/64]
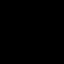
[frame 16/64]
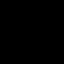
[frame 27/64]
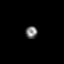
[frame 38/64]
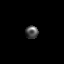
[frame 48/64]
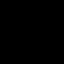
[frame 59/64]
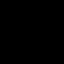

[18 of 18 positions shown; findings below may reference images not displayed]

Canned report from images found in remote index.

Refer to host system for actual result text.
